# Patient Record
Sex: Female | Born: 1955
Health system: Southern US, Community
[De-identification: ages and names within clinical notes are randomized; demographics above are authoritative.]

## PROBLEM LIST (undated history)

## (undated) DIAGNOSIS — Q851 Tuberous sclerosis: Secondary | ICD-10-CM

## (undated) DIAGNOSIS — R739 Hyperglycemia, unspecified: Secondary | ICD-10-CM

## (undated) DIAGNOSIS — G4730 Sleep apnea, unspecified: Secondary | ICD-10-CM

## (undated) DIAGNOSIS — I10 Essential (primary) hypertension: Secondary | ICD-10-CM

## (undated) DIAGNOSIS — E042 Nontoxic multinodular goiter: Secondary | ICD-10-CM

## (undated) DIAGNOSIS — E039 Hypothyroidism, unspecified: Secondary | ICD-10-CM

## (undated) DIAGNOSIS — R569 Unspecified convulsions: Secondary | ICD-10-CM

## (undated) HISTORY — DX: Unspecified convulsions: R56.9

## (undated) HISTORY — PX: FOOT SURGERY: SHX648

## (undated) HISTORY — DX: Tuberous sclerosis: Q85.1

## (undated) HISTORY — DX: Hyperglycemia, unspecified: R73.9

## (undated) HISTORY — PX: TONSILLECTOMY: SHX5217

## (undated) HISTORY — PX: BREAST EXCISIONAL BIOPSY: SUR124

## (undated) HISTORY — DX: Sleep apnea, unspecified: G47.30

## (undated) HISTORY — DX: Nontoxic multinodular goiter: E04.2

## (undated) HISTORY — PX: BREAST SURGERY: SHX581

## (undated) HISTORY — PX: VARICOSE VEIN SURGERY: SHX832

## (undated) HISTORY — DX: Hypothyroidism, unspecified: E03.9

## (undated) HISTORY — DX: Essential (primary) hypertension: I10

## (undated) HISTORY — PX: LASIK: SHX215

---

## 1993-12-27 HISTORY — PX: ABDOMINAL HYSTERECTOMY: SHX81

## 1998-02-17 ENCOUNTER — Ambulatory Visit (HOSPITAL_COMMUNITY): Admission: RE | Admit: 1998-02-17 | Discharge: 1998-02-17 | Payer: Self-pay | Admitting: Obstetrics and Gynecology

## 1999-01-02 ENCOUNTER — Ambulatory Visit (HOSPITAL_COMMUNITY): Admission: RE | Admit: 1999-01-02 | Discharge: 1999-01-02 | Payer: Self-pay | Admitting: Obstetrics and Gynecology

## 1999-01-02 ENCOUNTER — Encounter: Payer: Self-pay | Admitting: Obstetrics and Gynecology

## 2000-01-11 ENCOUNTER — Encounter: Payer: Self-pay | Admitting: Obstetrics and Gynecology

## 2000-01-11 ENCOUNTER — Ambulatory Visit (HOSPITAL_COMMUNITY): Admission: RE | Admit: 2000-01-11 | Discharge: 2000-01-11 | Payer: Self-pay | Admitting: Obstetrics and Gynecology

## 2001-01-16 ENCOUNTER — Ambulatory Visit (HOSPITAL_COMMUNITY): Admission: RE | Admit: 2001-01-16 | Discharge: 2001-01-16 | Payer: Self-pay | Admitting: Obstetrics and Gynecology

## 2001-01-16 ENCOUNTER — Encounter: Payer: Self-pay | Admitting: Obstetrics and Gynecology

## 2002-02-01 ENCOUNTER — Ambulatory Visit (HOSPITAL_COMMUNITY): Admission: RE | Admit: 2002-02-01 | Discharge: 2002-02-01 | Payer: Self-pay | Admitting: Obstetrics and Gynecology

## 2002-02-01 ENCOUNTER — Encounter: Payer: Self-pay | Admitting: Obstetrics and Gynecology

## 2003-02-26 ENCOUNTER — Encounter: Payer: Self-pay | Admitting: Obstetrics and Gynecology

## 2003-02-26 ENCOUNTER — Ambulatory Visit (HOSPITAL_COMMUNITY): Admission: RE | Admit: 2003-02-26 | Discharge: 2003-02-26 | Payer: Self-pay | Admitting: Obstetrics and Gynecology

## 2003-03-05 ENCOUNTER — Encounter: Admission: RE | Admit: 2003-03-05 | Discharge: 2003-03-05 | Payer: Self-pay | Admitting: Obstetrics and Gynecology

## 2003-03-05 ENCOUNTER — Encounter: Payer: Self-pay | Admitting: Obstetrics and Gynecology

## 2004-07-17 ENCOUNTER — Ambulatory Visit (HOSPITAL_COMMUNITY): Admission: RE | Admit: 2004-07-17 | Discharge: 2004-07-17 | Payer: Self-pay | Admitting: Obstetrics and Gynecology

## 2004-12-27 LAB — HM COLONOSCOPY

## 2005-04-23 ENCOUNTER — Ambulatory Visit (HOSPITAL_COMMUNITY): Admission: RE | Admit: 2005-04-23 | Discharge: 2005-04-23 | Payer: Self-pay | Admitting: Gastroenterology

## 2005-07-21 ENCOUNTER — Ambulatory Visit (HOSPITAL_COMMUNITY): Admission: RE | Admit: 2005-07-21 | Discharge: 2005-07-21 | Payer: Self-pay | Admitting: Obstetrics and Gynecology

## 2006-07-25 ENCOUNTER — Ambulatory Visit (HOSPITAL_COMMUNITY): Admission: RE | Admit: 2006-07-25 | Discharge: 2006-07-25 | Payer: Self-pay | Admitting: Obstetrics and Gynecology

## 2007-07-27 ENCOUNTER — Ambulatory Visit (HOSPITAL_COMMUNITY): Admission: RE | Admit: 2007-07-27 | Discharge: 2007-07-27 | Payer: Self-pay | Admitting: Obstetrics and Gynecology

## 2007-07-27 LAB — HM MAMMOGRAPHY

## 2007-08-04 ENCOUNTER — Encounter: Admission: RE | Admit: 2007-08-04 | Discharge: 2007-08-04 | Payer: Self-pay | Admitting: Obstetrics and Gynecology

## 2008-08-06 ENCOUNTER — Ambulatory Visit (HOSPITAL_COMMUNITY): Admission: RE | Admit: 2008-08-06 | Discharge: 2008-08-06 | Payer: Self-pay | Admitting: Obstetrics and Gynecology

## 2008-08-09 ENCOUNTER — Encounter: Admission: RE | Admit: 2008-08-09 | Discharge: 2008-08-09 | Payer: Self-pay | Admitting: Obstetrics and Gynecology

## 2009-08-08 ENCOUNTER — Ambulatory Visit (HOSPITAL_COMMUNITY): Admission: RE | Admit: 2009-08-08 | Discharge: 2009-08-08 | Payer: Self-pay | Admitting: Endocrinology

## 2010-08-10 ENCOUNTER — Encounter: Admission: RE | Admit: 2010-08-10 | Discharge: 2010-08-10 | Payer: Self-pay | Admitting: Endocrinology

## 2010-08-11 ENCOUNTER — Ambulatory Visit (HOSPITAL_COMMUNITY): Admission: RE | Admit: 2010-08-11 | Discharge: 2010-08-11 | Payer: Self-pay | Admitting: Endocrinology

## 2011-01-17 ENCOUNTER — Encounter: Payer: Self-pay | Admitting: Obstetrics and Gynecology

## 2011-05-14 NOTE — Op Note (Signed)
NAMEMARLIN, BRYS               ACCOUNT NO.:  [id number]   MEDICAL RECORD NO.:  [id number]          PATIENT TYPE:  AMB   LOCATION:  ENDO                         FACILITY:  [facility]   PHYSICIAN:  John C. Madilyn Fireman, M.D.    DATE OF BIRTH:  1956/12/04   DATE OF PROCEDURE:  04/23/2005  DATE OF DISCHARGE:                                 OPERATIVE REPORT   PROCEDURE:  Colonoscopy.   INDICATIONS FOR PROCEDURE:  Average risk colon cancer screening.   DESCRIPTION OF PROCEDURE:  The patient was placed in the left lateral  decubitus position and placed on the pulse monitor with continuous low-flow  oxygen delivered by nasal cannula.  She was sedated with 75 mcg IV fentanyl  and 7 mg IV Versed.  The Olympus video colonoscope was inserted into the  rectum and advanced to the cecum, confirmed by transillumination of  McBurney's point and visualization of the ileocecal valve and appendiceal  orifice.  Prep was excellent.  The cecum, ascending, transverse, descending,  and sigmoid colon all appeared normal with no masses, polyps, diverticula,  or other mucosal abnormalities.  The rectum likewise appeared normal and  retroflexed view of the anus revealed small internal hemorrhoids.  The scope  was then withdrawn and the patient returned to the recovery room in stable  condition.  She  tolerated the procedure well and there were no immediate  complications.   IMPRESSION:  Small internal hemorrhoids; otherwise normal colonoscopy.   PLAN:  Repeat colonoscopy within 10 years and consider flexible  sigmoidoscopy or hemoccults in five years.      JCH/MEDQ  D:  04/23/2005  T:  04/23/2005  Job:  [id number]   cc:   Darius Bump, M.D.  [place]  [place] [address]  Fax: [phone]

## 2012-06-19 ENCOUNTER — Other Ambulatory Visit (HOSPITAL_COMMUNITY): Payer: Self-pay | Admitting: Endocrinology

## 2012-06-19 DIAGNOSIS — Z1231 Encounter for screening mammogram for malignant neoplasm of breast: Secondary | ICD-10-CM

## 2012-07-12 ENCOUNTER — Ambulatory Visit (HOSPITAL_COMMUNITY)
Admission: RE | Admit: 2012-07-12 | Discharge: 2012-07-12 | Disposition: A | Discharge status: Home / Self Care | Payer: BC Managed Care – PPO | Source: Ambulatory Visit | Attending: Endocrinology | Admitting: Endocrinology

## 2012-07-12 DIAGNOSIS — Z1231 Encounter for screening mammogram for malignant neoplasm of breast: Secondary | ICD-10-CM

## 2012-07-27 LAB — HM MAMMOGRAPHY: HM Mammogram: NORMAL

## 2012-08-10 ENCOUNTER — Other Ambulatory Visit: Payer: Self-pay | Admitting: Endocrinology

## 2012-08-10 DIAGNOSIS — E049 Nontoxic goiter, unspecified: Secondary | ICD-10-CM

## 2012-08-11 ENCOUNTER — Ambulatory Visit
Admission: RE | Admit: 2012-08-11 | Discharge: 2012-08-11 | Disposition: A | Discharge status: Home / Self Care | Payer: BC Managed Care – PPO | Source: Ambulatory Visit | Attending: Endocrinology | Admitting: Endocrinology

## 2012-08-11 DIAGNOSIS — E049 Nontoxic goiter, unspecified: Secondary | ICD-10-CM

## 2012-08-31 ENCOUNTER — Encounter: Payer: Self-pay | Admitting: Family Medicine

## 2012-09-01 ENCOUNTER — Encounter: Payer: Self-pay | Admitting: *Deleted

## 2012-09-07 ENCOUNTER — Encounter: Payer: Self-pay | Admitting: *Deleted

## 2012-09-20 ENCOUNTER — Ambulatory Visit (INDEPENDENT_AMBULATORY_CARE_PROVIDER_SITE_OTHER): Payer: BC Managed Care – PPO | Admitting: Family Medicine

## 2012-09-20 ENCOUNTER — Encounter: Payer: Self-pay | Admitting: Family Medicine

## 2012-09-20 VITALS — BP 118/72 | HR 68 | Ht 67.25 in | Wt 264.0 lb

## 2012-09-20 DIAGNOSIS — R5383 Other fatigue: Secondary | ICD-10-CM

## 2012-09-20 DIAGNOSIS — R5381 Other malaise: Secondary | ICD-10-CM

## 2012-09-20 DIAGNOSIS — Z13220 Encounter for screening for lipoid disorders: Secondary | ICD-10-CM

## 2012-09-20 DIAGNOSIS — Z0000 Encounter for general adult medical examination without abnormal findings: Secondary | ICD-10-CM

## 2012-09-20 DIAGNOSIS — E039 Hypothyroidism, unspecified: Secondary | ICD-10-CM

## 2012-09-20 DIAGNOSIS — Z23 Encounter for immunization: Secondary | ICD-10-CM

## 2012-09-20 DIAGNOSIS — Z6841 Body Mass Index (BMI) 40.0 and over, adult: Secondary | ICD-10-CM

## 2012-09-20 LAB — LIPID PANEL
Cholesterol: 162 mg/dL (ref 0–200)
LDL Cholesterol: 90 mg/dL (ref 0–99)
Total CHOL/HDL Ratio: 2.5 Ratio
Triglycerides: 41 mg/dL (ref <150)
VLDL: 8 mg/dL (ref 0–40)

## 2012-09-20 LAB — CBC WITH DIFFERENTIAL/PLATELET
Eosinophils Absolute: 0.3 10*3/uL (ref 0.0–0.7)
Eosinophils Relative: 4 % (ref 0–5)
Hemoglobin: 13.9 g/dL (ref 12.0–15.0)
MCH: 29.1 pg (ref 26.0–34.0)
MCHC: 33.7 g/dL (ref 30.0–36.0)
Monocytes Absolute: 0.5 10*3/uL (ref 0.1–1.0)
Neutro Abs: 4.5 10*3/uL (ref 1.7–7.7)
Neutrophils Relative %: 65 % (ref 43–77)
Platelets: 247 10*3/uL (ref 150–400)
RBC: 4.77 MIL/uL (ref 3.87–5.11)
WBC: 6.8 10*3/uL (ref 4.0–10.5)

## 2012-09-20 LAB — POCT URINALYSIS DIPSTICK
Bilirubin, UA: NEGATIVE
Blood, UA: NEGATIVE
Glucose, UA: NEGATIVE
Spec Grav, UA: 1.015
pH, UA: 5

## 2012-09-20 NOTE — Patient Instructions (Addendum)
HEALTH MAINTENANCE RECOMMENDATIONS:  It is recommended that you get at least 30 minutes of aerobic exercise at least 5 days/week (for weight loss, you may need as much as 60-90 minutes). This can be any activity that gets your heart rate up. This can be divided in 10-15 minute intervals if needed, but try and build up your endurance at least once a week.  Weight bearing exercise is also recommended twice weekly.  Eat a healthy diet with lots of vegetables, fruits and fiber.  "Colorful" foods have a lot of vitamins (ie green vegetables, tomatoes, red peppers, etc).  Limit sweet tea, regular sodas and alcoholic beverages, all of which has a lot of calories and sugar.  Up to 1 alcoholic drink daily may be beneficial for women (unless trying to lose weight, watch sugars).  Drink a lot of water.  Calcium recommendations are 1200-1500 mg daily (1500 mg for postmenopausal women or women without ovaries), and vitamin D 1000 IU daily.  This should be obtained from diet and/or supplements (vitamins), and calcium should not be taken all at once, but in divided doses.  Monthly self breast exams and yearly mammograms for women over the age of 58 is recommended.  Sunscreen of at least SPF 30 should be used on all sun-exposed parts of the skin when outside between the hours of 10 am and 4 pm (not just when at beach or pool, but even with exercise, golf, tennis, and yard work!)  Use a sunscreen that says "broad spectrum" so it covers both UVA and UVB rays, and make sure to reapply every 1-2 hours.  Remember to change the batteries in your smoke detectors when changing your clock times in the spring and fall.  Use your seat belt every time you are in a car, and please drive safely and not be distracted with cell phones and texting while driving.  Check with insurance regarding shingles vaccine (zostavax) and schedule a nurse visit if interested.

## 2012-09-20 NOTE — Progress Notes (Signed)
Chief Complaint  Patient presents with  . Annual Exam    new patient fasting annual exam with pap. UA shows 1+ leuks-pt asymptomatic. Will get flu vaccine the [facility].   April Garcia is a 56 y.o. female who presents for a complete physical.  She has the following concerns: None--just to establish care.  Immunization History  Administered Date(s) Administered  . Td 02/24/2005   Last Pap smear: 2007 Last mammogram: 07/2012 Last colonoscopy: 2006 Last DEXA: 2006 Dentist: every 3 months for cleanings Ophtho: 2 years ago Exercise: 5 miles/day on exercise bike (but not in the last month due to pulled muscle)  Past Medical History  Diagnosis Date  . Elevated blood sugar   . Multinodular goiter   . Hypothyroidism     followed by Dr. Talmage Nap  . Tuberous sclerosis     diagnosed by Dr. Nicholas Lose  . Seizure childhood    off medication since age 42    Past Surgical History  Procedure Date  . Lasik   . Abdominal hysterectomy 1995    for fibroids (Dr. Elana Alm)  . Breast surgery     cyst removal from right breast.  . Tonsillectomy age 6    History   Social History  . Marital Status: Single    Spouse Name: N/A    Number of Children: N/A  . 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 Smokeless tobacco: Never Used  . Alcohol Use: Yes     maybe 2-5 times per year.  . Drug Use: No  . Sexually Active: Not Currently   Other Topics Concern  . Not on file   Social History Narrative   Single, has 1 dog Facilities manager)    Family History  Problem Relation Age of Onset  . Breast cancer Mother 60    stage 0  . Hypothyroidism Mother   . Dementia Father   . Heart disease Father     arrhythmia (cardioverted)  . Hypertension Father   . Colon polyps Father     "precancerous"  . Breast cancer Maternal Aunt   . Breast cancer Maternal Aunt     Current outpatient  prescriptions:levothyroxine (SYNTHROID, LEVOTHROID) 125 MCG tablet, Take 125 mcg by mouth daily., Disp: , Rfl:  Takes a MVI when she remembers  Allergies  Allergen Reactions  . Adhesive (Tape)     ROS:  The patient denies anorexia, fever, weight changes, headaches,  vision changes, decreased hearing, ear pain, sore throat, breast concerns, chest pain, palpitations, dizziness, syncope, dyspnea on exertion, cough, swelling, nausea, vomiting, diarrhea, constipation, abdominal pain, melena, hematochezia, indigestion/heartburn, hematuria, incontinence, dysuria, vaginal bleeding, discharge, odor or itch, genital lesions, joint pains, numbness, tingling, weakness, tremor, suspicious skin lesions, depression, anxiety, abnormal bleeding/bruising, or enlarged lymph nodes. Recent pain at muscle medial to R knee, improved.  PHYSICAL EXAM: BP 118/72  Pulse 68  Ht 5' 7.25" (1.708 m)  Wt 264 lb (119.75 kg)  BMI 41.04 kg/m2  General Appearance:    Alert, cooperative, no distress, appears stated age  Head:    Normocephalic, without obvious abnormality, atraumatic  Eyes:    PERRL, conjunctiva/corneas clear, EOM's intact, fundi    benign  Ears:    Normal TM's and external ear canals  Nose:   Nares normal, mucosa normal, no drainage or sinus   tenderness  Throat:   Lips, mucosa,  and tongue normal; teeth and gums normal  Neck:   Supple, no lymphadenopathy;  Thyroid: Thyroid enlarged, nodule palpable on right, possibly small one on left also; no carotid bruit or JVD  Back:    Spine nontender, no curvature, ROM normal, no CVA     tenderness  Lungs:     Clear to auscultation bilaterally without wheezes, rales or     ronchi; respirations unlabored  Chest Wall:    No tenderness or deformity   Heart:    Regular rate and rhythm, S1 and S2 normal, no murmur, rub   or gallop  Breast Exam:    No tenderness, masses, or nipple discharge or inversion.      No axillary lymphadenopathy  Abdomen:     Soft, non-tender,  obese, nondistended, normoactive bowel sounds,  no masses, no hepatosplenomegaly  Genitalia:    Normal external genitalia without lesions.  BUS and vagina normal; surgically absent uterus.  No adnexal masses or tenderness, but limited by body habitus.  No pap performed (just bimanual exam)  Rectal:    Normal tone, no masses or tenderness; guaiac negative stool  Extremities:   No clubbing, cyanosis; nonpitting edema at ankles bilaterally  Pulses:   2+ and symmetric all extremities  Skin:   Skin color, texture, turgor normal.  Small nonpigmented papules across back and elsewhere, nontender. Onychomycosis of both feet toenails  Lymph nodes:   Cervical, supraclavicular, and axillary nodes normal  Neurologic:   CNII-XII intact, normal strength, sensation and gait; reflexes 2+ and symmetric throughout          Psych:   Normal mood, affect, hygiene and grooming.    ASSESSMENT/PLAN: 1. Routine general medical examination at a health care facility  POCT Urinalysis Dipstick, Visual acuity screening, Lipid panel, Comprehensive metabolic panel, CBC with Differential, Vitamin D 25 hydroxy  2. Other malaise and fatigue  Comprehensive metabolic panel, CBC with Differential, Vitamin D 25 hydroxy  3. Screening for lipoid disorders  Lipid panel  4. Need for Tdap vaccination  Tdap vaccine greater than or equal to 7yo IM  5. Unspecified hypothyroidism    6. Morbid obesity with BMI of 40.0-44.9, adult     Hasn't had labs in 5 years--do labs today.  Not thyroid, as this is monitored by Dr. Talmage Nap  Discussed monthly self breast exams and yearly mammograms after the age of 35; at least 30 minutes of aerobic activity at least 5 days/week; proper sunscreen use reviewed; healthy diet, including goals of calcium and vitamin D intake and alcohol recommendations (less than or equal to 1 drink/day) reviewed; regular seatbelt use; changing batteries in smoke detectors.  Immunization recommendations discussed.  Colonoscopy  recommendations reviewed. Hemoccult kit given  Gets flu shot through school in October Given TdaP today. Check insurance regarding shingles coverage--risks reviewed.  [place] if interested

## 2012-09-21 ENCOUNTER — Encounter: Payer: Self-pay | Admitting: Family Medicine

## 2012-09-21 DIAGNOSIS — E039 Hypothyroidism, unspecified: Secondary | ICD-10-CM | POA: Insufficient documentation

## 2012-12-27 DIAGNOSIS — I10 Essential (primary) hypertension: Secondary | ICD-10-CM

## 2012-12-27 HISTORY — DX: Essential (primary) hypertension: I10

## 2013-01-11 ENCOUNTER — Ambulatory Visit (INDEPENDENT_AMBULATORY_CARE_PROVIDER_SITE_OTHER): Payer: BC Managed Care – PPO | Admitting: Family Medicine

## 2013-01-11 ENCOUNTER — Encounter: Payer: Self-pay | Admitting: Family Medicine

## 2013-01-11 ENCOUNTER — Ambulatory Visit: Payer: BC Managed Care – PPO | Admitting: Family Medicine

## 2013-01-11 VITALS — BP 164/104 | HR 76 | Ht 68.5 in | Wt 280.0 lb

## 2013-01-11 DIAGNOSIS — IMO0001 Reserved for inherently not codable concepts without codable children: Secondary | ICD-10-CM | POA: Insufficient documentation

## 2013-01-11 DIAGNOSIS — R030 Elevated blood-pressure reading, without diagnosis of hypertension: Secondary | ICD-10-CM

## 2013-01-11 NOTE — Patient Instructions (Signed)
I recommend checking your BP's regularly (keep list--date/am/pm/comments as columns).  If you are using an arm cuff, consider nurse visit to verify accuracy.  If cuff too small, try wrist monitor, and bring your monitor to your next visit.  If you develop headaches, numbness/tingling, chest pain, or any other neurologic symptoms, return immediately, and BP medications will need to be started.  Otherwise, let's try and see if BP can improve with exercise, weight loss and dietary measures.   Hypertension As your heart beats, it forces blood through your arteries. This force is your blood pressure. If the pressure is too high, it is called hypertension (HTN) or high blood pressure. HTN is dangerous because you may have it and not know it. High blood pressure may mean that your heart has to work harder to pump blood. Your arteries may be narrow or stiff. The extra work puts you at risk for heart disease, stroke, and other problems.  Blood pressure consists of two numbers, a higher number over a lower, 110/72, for example. It is stated as "110 over 72." The ideal is below 120 for the top number (systolic) and under 80 for the bottom (diastolic). Write down your blood pressure today. You should pay close attention to your blood pressure if you have certain conditions such as:  Heart failure.  Prior heart attack.  Diabetes  Chronic kidney disease.  Prior stroke.  Multiple risk factors for heart disease. To see if you have HTN, your blood pressure should be measured while you are seated with your arm held at the level of the heart. It should be measured at least twice. A one-time elevated blood pressure reading (especially in the Emergency Department) does not mean that you need treatment. There may be conditions in which the blood pressure is different between your right and left arms. It is important to see your caregiver soon for a recheck. Most people have essential hypertension which means that  there is not a specific cause. This type of high blood pressure may be lowered by changing lifestyle factors such as:  Stress.  Smoking.  Lack of exercise.  Excessive weight.  Drug/tobacco/alcohol use.  Eating less salt. Most people do not have symptoms from high blood pressure until it has caused damage to the body. Effective treatment can often prevent, delay or reduce that damage. TREATMENT  When a cause has been identified, treatment for high blood pressure is directed at the cause. There are a large number of medications to treat HTN. These fall into several categories, and your caregiver will help you select the medicines that are best for you. Medications may have side effects. You should review side effects with your caregiver. If your blood pressure stays high after you have made lifestyle changes or started on medicines,   Your medication(s) may need to be changed.  Other problems may need to be addressed.  Be certain you understand your prescriptions, and know how and when to take your medicine.  Be sure to follow up with your caregiver within the time frame advised (usually within two weeks) to have your blood pressure rechecked and to review your medications.  If you are taking more than one medicine to lower your blood pressure, make sure you know how and at what times they should be taken. Taking two medicines at the same time can result in blood pressure that is too low. SEEK IMMEDIATE MEDICAL CARE IF:  You develop a severe headache, blurred or changing vision, or confusion.  You have unusual weakness or numbness, or a faint feeling.  You have severe chest or abdominal pain, vomiting, or breathing problems. MAKE SURE YOU:   Understand these instructions.  Will watch your condition.  Will get help right away if you are not doing well or get worse. Document Released: 12/13/2005 Document Revised: 03/06/2012 Document Reviewed: 08/02/2008 [facility] Patient  Information 2013 [place], [place].  2 Gram Low Sodium Diet A 2 gram sodium diet restricts the amount of sodium in the diet to no more than 2 g or 2000 mg daily. Limiting the amount of sodium is often used to help lower blood pressure. It is important if you have heart, liver, or kidney problems. Many foods contain sodium for flavor and sometimes as a preservative. When the amount of sodium in a diet needs to be low, it is important to know what to look for when choosing foods and drinks. The following includes some information and guidelines to help make it easier for you to adapt to a low sodium diet. QUICK TIPS  Do not add salt to food.  Avoid convenience items and fast food.  Choose unsalted snack foods.  Buy lower sodium products, often labeled as "lower sodium" or "no salt added."  Check food labels to learn how much sodium is in 1 serving.  When eating at a restaurant, ask that your food be prepared with less salt or none, if possible. READING FOOD LABELS FOR SODIUM INFORMATION The nutrition facts label is a good place to find how much sodium is in foods. Look for products with no more than 500 to 600 mg of sodium per meal and no more than 150 mg per serving. Remember that 2 g = 2000 mg. The food label may also list foods as:  Sodium-free: Less than 5 mg in a serving.  Very low sodium: 35 mg or less in a serving.  Low-sodium: 140 mg or less in a serving.  Light in sodium: 50% less sodium in a serving. For example, if a food that usually has 300 mg of sodium is changed to become light in sodium, it will have 150 mg of sodium.  Reduced sodium: 25% less sodium in a serving. For example, if a food that usually has 400 mg of sodium is changed to reduced sodium, it will have 300 mg of sodium. CHOOSING FOODS Grains  Avoid: Salted crackers and snack items. Some cereals, including instant hot cereals. Bread stuffing and biscuit mixes. Seasoned rice or pasta mixes.  Choose: Unsalted  snack items. Low-sodium cereals, oats, puffed wheat and rice, shredded wheat. English muffins and bread. Pasta. Meats  Avoid: Salted, canned, smoked, spiced, pickled meats, including fish and poultry. Bacon, ham, sausage, cold cuts, hot dogs, anchovies.  Choose: Low-sodium canned tuna and salmon. Fresh or frozen meat, poultry, and fish. Dairy  Avoid: Processed cheese and spreads. Cottage cheese. Buttermilk and condensed milk. Regular cheese.  Choose: Milk. Low-sodium cottage cheese. Yogurt. Sour cream. Low-sodium cheese. Fruits and Vegetables  Avoid: Regular canned vegetables. Regular canned tomato sauce and paste. Frozen vegetables in sauces. Olives. Rosita Fire. Relishes. Sauerkraut.  Choose: Low-sodium canned vegetables. Low-sodium tomato sauce and paste. Frozen or fresh vegetables. Fresh and frozen fruit. Condiments  Avoid: Canned and packaged gravies. Worcestershire sauce. Tartar sauce. Barbecue sauce. Soy sauce. Steak sauce. Ketchup. Onion, garlic, and table salt. Meat flavorings and tenderizers.  Choose: Fresh and dried herbs and spices. Low-sodium varieties of mustard and ketchup. Lemon juice. Tabasco sauce. Horseradish. SAMPLE 2 GRAM SODIUM MEAL  PLAN Breakfast / Sodium (mg)  1 cup low-fat milk / 143 mg  2 slices whole-wheat toast / 270 mg  1 tbs heart-healthy margarine / 153 mg  1 hard-boiled egg / 139 mg  1 small orange / 0 mg Lunch / Sodium (mg)  1 cup raw carrots / 76 mg   cup hummus / 298 mg  1 cup low-fat milk / 143 mg   cup red grapes / 2 mg  1 whole-wheat pita bread / 356 mg Dinner / Sodium (mg)  1 cup whole-wheat pasta / 2 mg  1 cup low-sodium tomato sauce / 73 mg  3 oz lean ground beef / 57 mg  1 small side salad (1 cup raw spinach leaves,  cup cucumber,  cup yellow bell pepper) with 1 tsp olive oil and 1 tsp red wine vinegar / 25 mg Snack / Sodium (mg)  1 container low-fat vanilla yogurt / 107 mg  3 graham cracker squares / 127 mg Nutrient  Analysis  Calories: 2033  Protein: 77 g  Carbohydrate: 282 g  Fat: 72 g  Sodium: 1971 mg Document Released: 12/13/2005 Document Revised: 03/06/2012 Document Reviewed: 03/16/2010 [facility] Patient Information 2013 [place], [place].

## 2013-01-11 NOTE — Progress Notes (Signed)
Chief Complaint  Patient presents with  . Hypertension    went to dentist yesterday and bp was high. she has gone to [employer] and everything has been fine   Yesterday while at dentist she was found to have a high BP, reading 177/103 in her upper arm, and 168/88 in her wrist.  She wasn't in any pain, and denied feeling particularly anxious.  She had her BP checked this morning at work and was 150/111 on upper arm, wasn't sure if reading accurate through sweater, so moved this arm cuff down to the forearm, and got 160/117.  First thing in the morning she has noticed her heart more than normal--heart wasn't fast, no palpitations, but just more aware of her heart.  Thought her BP could be high.  Denies eating canned foods, processed meats.  Occasional fast food. No chinese food.  She hasn't been getting any exercise recently (used to use exercise bike 30 minutes daily).  She has gained 16 pounds since she was last here in September (when her BP was normal).  She did some "emotional eating" after her dog died.  Dr. Talmage Nap monitors her thyroid, last checked in August.  She denies any skin/bowel/hair or mood changes  She denies having headaches, sometimes just feels a little "fuzzy", very dull headache. Denies chest pain, shortness of breath, neurologic symptoms.  She is concerned about her blood pressure, due to family h/o strokes in 2 cousins (one at age 74, another at age 63).  With this family history, she is very concerned about her BP, anxious.  Past Medical History  Diagnosis Date  . Elevated blood sugar   . Multinodular goiter   . Hypothyroidism     followed by Dr. Talmage Nap  . Tuberous sclerosis     diagnosed by Dr. Nicholas Lose  . Seizure childhood    off medication since age 62   Past Surgical History  Procedure Date  . Lasik   . Abdominal hysterectomy 1995    for fibroids (Dr. Elana Alm)  . Breast surgery     cyst removal from right breast.  . Tonsillectomy age 48  . Varicose vein surgery       Dr. Guss Bunde   Family History  Problem Relation Age of Onset  . Breast cancer Mother 57    stage 0  . Hypothyroidism Mother   . Dementia Father   . Heart disease Father     arrhythmia (cardioverted)  . Hypertension Father   . Colon polyps Father     "precancerous"  . Breast cancer Maternal Aunt   . Breast cancer Maternal Aunt   . Stroke Cousin   . Stroke Cousin    History   Social History  . Marital Status: Single    Spouse Name: N/A    Number of Children: N/A  . 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 Smokeless tobacco: Never Used  . Alcohol Use: Yes     Comment: maybe 2-5 times per year.  . Drug Use: No  . Sexually Active: Not Currently   Other Topics Concern  . Not on file   Social History Narrative   Single, has 1 dog.    Current Outpatient Prescriptions on File Prior to Visit  Medication Sig Dispense Refill  . levothyroxine (SYNTHROID, LEVOTHROID) 125 MCG tablet Take 125 mcg by mouth daily.  Allergies  Allergen Reactions  . Adhesive (Tape)    ROS:  Denies fevers, URI symptom, headaches, dizziness, chest pain, nausea, vomiting, diarrhea, constipation, cough, shortness of breath, depression/anxiety, joint pains, or other concerns.  See HPI  PHYSICAL EXAM: BP 164/104  Pulse 76  Ht 5' 8.5" (1.74 m)  Wt 280 lb (127.007 kg)  BMI 41.95 kg/m2 164/104 on RA by MD Pleasant, mildly anxious-appearing obese female, in no distress HEENT:  PERRL, EOMI, conjunctiva and fundi normal.  OP clear Neck: no lymphadenopathy, thyromegaly or mass. No carotid bruit Heart: regular rate and rhythm without murmur Lungs: clear bilaterally Abdomen: soft, nontender, no organomegaly or mass, no abdominal bruit Extremities: no edema, 2+ pulse Psych: mildly anxious, normal hygiene and grooming, normal range of affect Neuro: alert and oriented,  cranial nerves 2-12 intact. Normal strength, sensation, gait  ASSESSMENT/PLAN: 1. Elevated BP    No h/o HTN, but clearly has persistently elevated BP's since at dentist yesterday.  BP's normal at last visit, prior to weight gain.  Counseled extensively regarding low sodium diet, daily exercise, weight loss, risks of HTN.  Discussed starting med now, and cutting back on dose as her BP drops, vs behavioral/dietary trial, and adding meds if BP's do not respond.  She prefers the latter, which is reasonable, although if not showing any response, not to wait until next visit in 6 weeks, to return sooner.  Understands need for immediate eval for headache, neurologic symptoms, chest pain, exertional dyspnea

## 2013-02-22 ENCOUNTER — Encounter: Payer: Self-pay | Admitting: Family Medicine

## 2013-02-22 ENCOUNTER — Ambulatory Visit (INDEPENDENT_AMBULATORY_CARE_PROVIDER_SITE_OTHER): Payer: BC Managed Care – PPO | Admitting: Family Medicine

## 2013-02-22 VITALS — BP 160/118 | HR 80 | Ht 68.5 in | Wt 275.0 lb

## 2013-02-22 DIAGNOSIS — Z6841 Body Mass Index (BMI) 40.0 and over, adult: Secondary | ICD-10-CM

## 2013-02-22 DIAGNOSIS — I10 Essential (primary) hypertension: Secondary | ICD-10-CM

## 2013-02-22 DIAGNOSIS — E039 Hypothyroidism, unspecified: Secondary | ICD-10-CM

## 2013-02-22 MED ORDER — LISINOPRIL-HYDROCHLOROTHIAZIDE 10-12.5 MG PO TABS: 1.0000 | ORAL_TABLET | Freq: Every day | ORAL | Status: DC

## 2013-02-22 NOTE — Progress Notes (Signed)
Chief Complaint  Patient presents with  . Hypertension    6 week bp follow up.   Patient presents for follow-up on her blood pressure.  She was seen last month for elevated blood pressure, after first being noted at dentist.  This was also after a significant weight gain since BP's were normal at her physical in the fall. She has bought a wrist monitor, and checking regularly at home.  Initial values are often high (140-150's/90-113), lower on repeat after doing some deep breathing for a few minutes (122-146/72-92).  Currently denies any headache, but feels like she can hear her pulse beating.  Once with high BP (146/113) she felt a little "fuzzy" while at work.  She has tried to cut back on her sodium.  She is trying to exercise more, but hasn't been able to be consistent.  She is riding bicycle 30 minutes 3x/week. She has lost 5 pounds since her last visit.  She sees Dr. Talmage Nap for her thyroid, last check was prior to the weight gain in the fall.  Other than having a harder time getting the weight off quickly, notices no other symptoms.  Denies hair/skin/bowel changes, fatigue.  Past Medical History  Diagnosis Date  . Elevated blood sugar   . Multinodular goiter   . Hypothyroidism     followed by Dr. Talmage Nap  . Tuberous sclerosis     diagnosed by Dr. Nicholas Lose  . Seizure childhood    off medication since age 29   Past Surgical History  Procedure Laterality Date  . Lasik    . Abdominal hysterectomy  1995    for fibroids (Dr. Elana Alm)  . Breast surgery      cyst removal from right breast.  . Tonsillectomy  age 58  . Varicose vein surgery      Dr. Guss Bunde   History   Social History  . Marital Status: Single    Spouse Name: N/A    Number of Children: N/A  . 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 Smokeless tobacco: Never Used  . Alcohol Use: Yes   Comment: maybe 2-5 times per year.  . Drug Use: No  . Sexually Active: Not Currently   Other Topics Concern  . Not on file   Social History Narrative   Single, has 1 dog.    Current outpatient prescriptions:levothyroxine (SYNTHROID, LEVOTHROID) 125 MCG tablet, Take 125 mcg by mouth daily., Disp: , Rfl:   Allergies  Allergen Reactions  . Adhesive (Tape)    ROS:  Denies fevers, URI symptoms, headache, dizziness, cough, shortness of breath, chest pain, palpitations (just sometimes feels her heart beating hard, and hears it in her ears).  Some intermittent muffled hearing ,improving after blowing her nose.  Denies urinary complaints, joint pains or other concerns.  PHYSICAL EXAM: BP 160/118  Pulse 80  Ht 5' 8.5" (1.74 m)  Wt 275 lb (124.739 kg)  BMI 41.2 kg/m2 156/121 on R wrist, pt machine (done her way, holding arm elevated) 150/117 on R wrist when holding monitor against heart as instructed 162/108 on R arm by MD with large cuff Pleasant, obese female in no distress Neck: no lymphadenopathy Heart: regular rate and rhythm without murmur Lungs: clear bilaterally Extremities: trace edema Skin: no rash, very slight stasis changes in LE's Psych:  Normal mood, affect, hygiene and grooming Neuro: alert and  oriented.  Cranial nerves intact.  Normal gait, strength, sensation  ASSESSMENT/PLAN: Essential hypertension, benign - Plan: lisinopril-hydrochlorothiazide (PRINZIDE,ZESTORETIC) 10-12.5 MG per tablet  Morbid obesity with BMI of 40.0-44.9, adult  Unspecified hypothyroidism  HTN:  Start Lisinopril HCT--risks and side effects reviewed in detail.  Plan to check b-met along with repeating TSH at next visit (and forward TSH to Dr. Talmage Nap if abnormal). Continue low sodium diet. Increase exercise frequency and duration, if possible Continue to monitor BP's at home.  Monitor seems fairly accurate. High potassium foods reviewed--increase if having muscle cramps/spasms.  Keep well  hydrated  F/u 3-4 weeks. To call sooner if having dizziness, severe cough or other side effects

## 2013-02-22 NOTE — Patient Instructions (Signed)
Continue low sodium diet. Increase exercise frequency and duration, if possible Continue to monitor BP's at home.  Monitor seems fairly accurate. High potassium foods reviewed--increase if having muscle cramps/spasms.  Keep well hydrated

## 2013-03-19 ENCOUNTER — Telehealth: Payer: Self-pay | Admitting: Family Medicine

## 2013-03-19 NOTE — Telephone Encounter (Signed)
She has appointment with you on Thursday to reck new bp meds. She had to stop them on Saturday. She developed a bad rash, felt like cross between poison ivy and bad sunburn. She read that 1% of people on this medication can have rash as side effect. The rash is still there but improving. She still plans to come in on Thursday but wanted to make you aware of this first

## 2013-03-19 NOTE — Telephone Encounter (Signed)
noted 

## 2013-03-22 ENCOUNTER — Ambulatory Visit (INDEPENDENT_AMBULATORY_CARE_PROVIDER_SITE_OTHER): Payer: BC Managed Care – PPO | Admitting: Family Medicine

## 2013-03-22 ENCOUNTER — Encounter: Payer: Self-pay | Admitting: Family Medicine

## 2013-03-22 VITALS — BP 176/110 | HR 60 | Ht 67.0 in | Wt 275.0 lb

## 2013-03-22 DIAGNOSIS — E039 Hypothyroidism, unspecified: Secondary | ICD-10-CM

## 2013-03-22 DIAGNOSIS — I10 Essential (primary) hypertension: Secondary | ICD-10-CM

## 2013-03-22 MED ORDER — AMLODIPINE BESYLATE 5 MG PO TABS: ORAL_TABLET | ORAL | Status: DC

## 2013-03-22 NOTE — Progress Notes (Signed)
Chief Complaint  Patient presents with  . Hypertension    4 week follow up on bp. Stopped taking bp medication-this past Sat was last dose. Brought pictures to show you her reaction.   Patient had a reaction to the lisinopril HCT.  Last week she developed a rash across neck, chest, into side of her face--was very red, itchy and hot.  She had no change in detergents, had worn the same sweater before.  Only change was the addition of the BP medication.  She stopped the medication 5 days ago.  BP was 110-130's/70's-80's while on the medication.  Since stopping med, BP's back up to 130's-160/90-114.  Denies headaches, dizziness, chest pain, edema or shortness of breath.  Rash has resolved.  Hypothyroidism--denies fatigue.  Has gained weight, but no additional weight gain since last visit.  +dry skin (winter), no decrease in energy, no hair loss. Sees Dr. Talmage Nap for her thyroid, last TSH was in the summer.  She gained the weight since her last TSH  Past Medical History  Diagnosis Date  . Elevated blood sugar   . Multinodular goiter   . Hypothyroidism     followed by Dr. Talmage Nap  . Tuberous sclerosis     diagnosed by Dr. Nicholas Lose  . Seizure childhood    off medication since age 61   Past Surgical History  Procedure Laterality Date  . Lasik    . Abdominal hysterectomy  1995    for fibroids (Dr. Elana Alm)  . Breast surgery      cyst removal from right breast.  . Tonsillectomy  age 10  . Varicose vein surgery      Dr. Guss Bunde   History   Social History  . Marital Status: Single    Spouse Name: N/A    Number of Children: N/A  . 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 Smokeless tobacco: Never Used  . Alcohol Use: Yes     Comment: maybe 2-5 times per year.  . Drug Use: No  . Sexually Active: Not Currently   Other Topics Concern  . Not on file   Social  History Narrative   Single, has 1 dog.    Current Outpatient Prescriptions on File Prior to Visit  Medication Sig Dispense Refill  . levothyroxine (SYNTHROID, LEVOTHROID) 125 MCG tablet Take 125 mcg by mouth daily.       No current facility-administered medications on file prior to visit.   Allergies  Allergen Reactions  . Adhesive (Tape)   . Lisinopril-Hydrochlorothiazide Rash   ROS:  Denies fevers, URI symptoms, cough, shortness of breath. Rash resolved.  No edema or other concerns.  Moods are good. No further weight gain  PHYSICAL EXAM: BP 176/110  Pulse 60  Ht 5\' 7"  (1.702 m)  Wt 275 lb (124.739 kg)  BMI 43.06 kg/m2 180/108 on repeat by MD, RA Neck: no lymphadenopathy, thyromegaly or mass Heart: regular rate and rhythm without murmur Lungs: clear bilaterally Extremities: no edema Skin no rash Psych: normal mood, affect, hygiene and grooming Neuro: alert and oriented, normal gait, cranial nerves.  ASSESSMENT/PLAN:  Essential hypertension, benign - Plan: amLODipine (NORVASC) 5 MG tablet, DISCONTINUED: amLODipine (NORVASC) 5 MG tablet  Unspecified hypothyroidism - Plan: TSH  HTN--was well controlled with lisinopril HCT, but she developed rash.  Start amlodipine 5mg .  Increase to 10mg  after 1-2 weeks if  BP remains >140/90.  Call to have rx changed to 10mg  if tolerating at the higher dose.  Continue to work on exercise and weight loss.  Check TSH today given significant weight gain since her last TSH, to ensure current dose is adequate.  Forward results to Dr. Talmage Nap  F/u 6 weeks on HTN

## 2013-03-22 NOTE — Patient Instructions (Addendum)
Start amlodipine 5mg .  Increase to 10mg  after 1-2 weeks if BP remains >140/90.  Call to have rx changed to 10mg  if tolerating at the higher dose.  Continue to work on exercise and weight loss.  Your prescription was sent to the nearby pharmacy--[facility] on [address] and [address] (it was also sent to [facility], so you will have it there for your refill, if needed).

## 2013-05-03 ENCOUNTER — Encounter: Payer: Self-pay | Admitting: Family Medicine

## 2013-05-03 ENCOUNTER — Ambulatory Visit (INDEPENDENT_AMBULATORY_CARE_PROVIDER_SITE_OTHER): Payer: BC Managed Care – PPO | Admitting: Family Medicine

## 2013-05-03 VITALS — BP 160/96 | HR 72 | Ht 67.0 in | Wt 275.0 lb

## 2013-05-03 DIAGNOSIS — Z6841 Body Mass Index (BMI) 40.0 and over, adult: Secondary | ICD-10-CM

## 2013-05-03 DIAGNOSIS — I10 Essential (primary) hypertension: Secondary | ICD-10-CM

## 2013-05-03 NOTE — Patient Instructions (Signed)
Continue to monitor your BP at home.  Clearly they are higher at the office, and so I will always ask what they are running at home, so please continue to check. Return sooner than September physical if having BP's too high, too low, or any other concerns.  Have pharmacy call when you need refills.

## 2013-05-03 NOTE — Progress Notes (Signed)
Chief Complaint  Patient presents with  . Hypertension    6 week follow up.     Patient presents for f/u on hypertension.  She was changed from lisinopril HCT to amlodipine after developing a rash.  She has been taking 5mg  daily and BP's at home had been running 115-138/70's-80's, so she maintained on the 5 mg dose.  Denies any side effects--just very slight swelling in feet.  No headaches, dizziness, chest pain.  Exercises sporadically, not on a consistent basis.  Hasn't had any change in weight.  Past Medical History  Diagnosis Date  . Elevated blood sugar   . Multinodular goiter   . Hypothyroidism     followed by Dr. Talmage Nap  . Tuberous sclerosis     diagnosed by Dr. Nicholas Lose  . Seizure childhood    off medication since age 17   Past Surgical History  Procedure Laterality Date  . Lasik    . Abdominal hysterectomy  1995    for fibroids (Dr. Elana Alm)  . Breast surgery      cyst removal from right breast.  . Tonsillectomy  age 37  . Varicose vein surgery      Dr. Guss Bunde   History   Social History  . Marital Status: Single    Spouse Name: N/A    Number of Children: N/A  . 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 Smokeless tobacco: Never Used  . Alcohol Use: Yes     Comment: maybe 2-5 times per year.  . Drug Use: No  . Sexually Active: Not Currently   Other Topics Concern  . Not on file   Social History Narrative   Single, has 1 dog.     Current outpatient prescriptions:amLODipine (NORVASC) 5 MG tablet, Increase to 2 tablets daily after 1-2 weeks if BP remains >140/90., Disp: 30 tablet, Rfl: 1;  levothyroxine (SYNTHROID, LEVOTHROID) 125 MCG tablet, Take 125 mcg by mouth daily., Disp: , Rfl:   Allergies  Allergen Reactions  . Adhesive (Tape)   . Lisinopril-Hydrochlorothiazide Rash   ROS:  Denies fevers, URI symptoms, chest pain,  headaches, dizziness, rashes or other concerns.  PHYSICAL EXAM: BP 160/96  Pulse 72  Ht 5\' 7"  (1.702 m)  Wt 275 lb (124.739 kg)  BMI 43.06 kg/m2 Well developed, pleasant female in no distress Repeat BP 160/100 RA With patient's wrist monitor on RA:  152/104 Heart: regular rate and rhythm Lungs: clear bilaterally Extremities: no edema noted Skin: no rash Psych: normal mood, affect, hygiene and grooming  ASSESSMENT/PLAN:  Essential hypertension, benign  Morbid obesity with BMI of 40.0-44.9, adult  HTN--white coat component.  Numbers at home and work are acceptable, and pt's machine appears accurate.  Therefore, continue 5mg  dose of amlodipine. Weight loss encouraged--daily exercise, dietary changes. Pharmacy to contact us when refills needed  F/u at [facility] as scheduled for September, sooner if having symptoms/problems, or if BP's are running too high or too low at home.

## 2013-05-19 ENCOUNTER — Other Ambulatory Visit: Payer: Self-pay | Admitting: Family Medicine

## 2013-07-06 ENCOUNTER — Other Ambulatory Visit: Payer: Self-pay | Admitting: Family Medicine

## 2013-07-06 DIAGNOSIS — Z1231 Encounter for screening mammogram for malignant neoplasm of breast: Secondary | ICD-10-CM

## 2013-07-13 ENCOUNTER — Ambulatory Visit (HOSPITAL_COMMUNITY)
Admission: RE | Admit: 2013-07-13 | Discharge: 2013-07-13 | Disposition: A | Discharge status: Home / Self Care | Payer: BC Managed Care – PPO | Source: Ambulatory Visit | Attending: Family Medicine | Admitting: Family Medicine

## 2013-07-13 DIAGNOSIS — Z1231 Encounter for screening mammogram for malignant neoplasm of breast: Secondary | ICD-10-CM

## 2013-07-13 LAB — HM MAMMOGRAPHY: HM Mammogram: NORMAL

## 2013-08-09 ENCOUNTER — Other Ambulatory Visit: Payer: Self-pay | Admitting: Endocrinology

## 2013-08-09 DIAGNOSIS — E049 Nontoxic goiter, unspecified: Secondary | ICD-10-CM

## 2013-08-13 ENCOUNTER — Ambulatory Visit
Admission: RE | Admit: 2013-08-13 | Discharge: 2013-08-13 | Disposition: A | Discharge status: Home / Self Care | Payer: BC Managed Care – PPO | Source: Ambulatory Visit | Attending: Endocrinology | Admitting: Endocrinology

## 2013-08-13 DIAGNOSIS — E049 Nontoxic goiter, unspecified: Secondary | ICD-10-CM

## 2013-08-27 HISTORY — PX: MOUTH SURGERY: SHX715

## 2013-10-01 ENCOUNTER — Ambulatory Visit (INDEPENDENT_AMBULATORY_CARE_PROVIDER_SITE_OTHER): Payer: BC Managed Care – PPO | Admitting: Family Medicine

## 2013-10-01 ENCOUNTER — Encounter: Payer: Self-pay | Admitting: Family Medicine

## 2013-10-01 VITALS — BP 150/90 | HR 64 | Ht 68.0 in | Wt 268.0 lb

## 2013-10-01 DIAGNOSIS — R82998 Other abnormal findings in urine: Secondary | ICD-10-CM

## 2013-10-01 DIAGNOSIS — T148XXA Other injury of unspecified body region, initial encounter: Secondary | ICD-10-CM

## 2013-10-01 DIAGNOSIS — E559 Vitamin D deficiency, unspecified: Secondary | ICD-10-CM

## 2013-10-01 DIAGNOSIS — Z0000 Encounter for general adult medical examination without abnormal findings: Secondary | ICD-10-CM

## 2013-10-01 DIAGNOSIS — R8290 Unspecified abnormal findings in urine: Secondary | ICD-10-CM

## 2013-10-01 DIAGNOSIS — I10 Essential (primary) hypertension: Secondary | ICD-10-CM

## 2013-10-01 LAB — CBC WITH DIFFERENTIAL/PLATELET
Basophils Absolute: 0 10*3/uL (ref 0.0–0.1)
Eosinophils Absolute: 0.2 10*3/uL (ref 0.0–0.7)
Lymphs Abs: 1.3 10*3/uL (ref 0.7–4.0)
MCH: 26.4 pg (ref 26.0–34.0)
Monocytes Relative: 7 % (ref 3–12)
Neutro Abs: 5 10*3/uL (ref 1.7–7.7)
Neutrophils Relative %: 71 % (ref 43–77)
Platelets: 261 10*3/uL (ref 150–400)
RBC: 4.81 MIL/uL (ref 3.87–5.11)
RDW: 16.1 % — ABNORMAL HIGH (ref 11.5–15.5)
WBC: 7 10*3/uL (ref 4.0–10.5)

## 2013-10-01 LAB — COMPREHENSIVE METABOLIC PANEL
AST: 12 U/L (ref 0–37)
Albumin: 4.3 g/dL (ref 3.5–5.2)
BUN: 20 mg/dL (ref 6–23)
Calcium: 9.3 mg/dL (ref 8.4–10.5)
Chloride: 105 mEq/L (ref 96–112)
Glucose, Bld: 89 mg/dL (ref 70–99)
Potassium: 3.9 mEq/L (ref 3.5–5.3)

## 2013-10-01 LAB — POCT UA - MICROSCOPIC ONLY

## 2013-10-01 LAB — POCT URINALYSIS DIPSTICK
Bilirubin, UA: NEGATIVE
Blood, UA: NEGATIVE
Ketones, UA: NEGATIVE
Nitrite, UA: NEGATIVE
Protein, UA: NEGATIVE
pH, UA: 5

## 2013-10-01 NOTE — Patient Instructions (Signed)
HEALTH MAINTENANCE RECOMMENDATIONS:  It is recommended that you get at least 30 minutes of aerobic exercise at least 5 days/week (for weight loss, you may need as much as 60-90 minutes). This can be any activity that gets your heart rate up. This can be divided in 10-15 minute intervals if needed, but try and build up your endurance at least once a week.  Weight bearing exercise is also recommended twice weekly.  Eat a healthy diet with lots of vegetables, fruits and fiber.  "Colorful" foods have a lot of vitamins (ie green vegetables, tomatoes, red peppers, etc).  Limit sweet tea, regular sodas and alcoholic beverages, all of which has a lot of calories and sugar.  Up to 1 alcoholic drink daily may be beneficial for women (unless trying to lose weight, watch sugars).  Drink a lot of water.  Calcium recommendations are 1200-1500 mg daily (1500 mg for postmenopausal women or women without ovaries), and vitamin D 1000 IU daily.  This should be obtained from diet and/or supplements (vitamins), and calcium should not be taken all at once, but in divided doses.  Monthly self breast exams and yearly mammograms for women over the age of 45 is recommended.  Sunscreen of at least SPF 30 should be used on all sun-exposed parts of the skin when outside between the hours of 10 am and 4 pm (not just when at beach or pool, but even with exercise, golf, tennis, and yard work!)  Use a sunscreen that says "broad spectrum" so it covers both UVA and UVB rays, and make sure to reapply every 1-2 hours.  Remember to change the batteries in your smoke detectors when changing your clock times in the spring and fall.  Use your seat belt every time you are in a car, and please drive safely and not be distracted with cell phones and texting while driving.   Continue to monitor blood pressures;  Bring list to next visit, along with your monitor to verify once again.

## 2013-10-01 NOTE — Progress Notes (Signed)
Chief Complaint  Patient presents with  . Annual Exam    fasting annual exam with either pap/pelvic, unsure of which is needed. Will get flu shot through school. UA showed  2+ leuks, patient is asymptomatic. No major concerns.    She donates blood every 2 months.  Had to stop for a while due to some anemia, but more recently giving regularly again.  The last time she tried to donate she was turned away due to Hg of 11.9, repeat was 12.2.  Since then, she changed MVI to one which contains iron.    She had oral surgery 9/26 (removed a cyst from upper jaw)--had a black L eye and a lot of bruising, more than they expected.  Had not been taking any NSAIDs prior to surgery.  Hypertension follow-up:  Blood pressures elsewhere are 105-132/66-84, pulse 68-84.  Denies dizziness, headaches, chest pain.  Denies side effects of medications--denies edema or constipation.  Saw Dr. Talmage Nap in August, had f/u ultrasound. No med changes made.  Immunization History  Administered Date(s) Administered  . Influenza Split 09/26/2012  . Td 02/24/2005  . Tdap 09/20/2012  gets flu shots through school--scheduled for 10/25/13 Last Pap smear: 2007 (s/p hysterectomy) Last mammogram: 06/2013  Last colonoscopy: 2006--she got a notice after 5 years, but hasn't gone yet Last DEXA: 2006  Dentist: every 3 months for cleanings  Ophtho: 3 years ago  Exercise: 5-6 miles/day on exercise bike every day  Past Medical History  Diagnosis Date  . Elevated blood sugar   . Multinodular goiter   . Hypothyroidism     followed by Dr. Talmage Nap  . Tuberous sclerosis     diagnosed by Dr. Nicholas Lose  . Seizure childhood    off medication since age 28  . Hypertension 2014    Past Surgical History  Procedure Laterality Date  . Lasik      monovision (R for distance, L for reading)  . Abdominal hysterectomy  1995    for fibroids (Dr. Elana Alm)  . Breast surgery      cyst removal from right breast.  . Tonsillectomy  age 22  . Varicose  vein surgery      Dr. Guss Bunde  . Mouth surgery  08/2013    cyst removed, upper jaw  . Foot surgery      toenails removed and tubers removed    History   Social History  . Marital Status: Single    Spouse Name: N/A    Number of Children: N/A  . 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 Smokeless tobacco: Never Used  . Alcohol Use: Yes     Comment: maybe 2-5 times per year.  . Drug Use: No  . Sexual Activity: Not Currently   Other Topics Concern  . Not on file   Social History Narrative   Single, has 1 dog (toy poodle)    Family History  Problem Relation Age of Onset  . Breast cancer Mother 10    stage 0  . Hypothyroidism Mother   . Dementia Father   . Heart disease Father     arrhythmia (cardioverted)  . Hypertension Father   . Colon polyps Father     "precancerous"  . Breast cancer Maternal Aunt   . Breast cancer Maternal Aunt   . Stroke Cousin   . Stroke Cousin   .  Diabetes Neg Hx   . Colon cancer Cousin 67    Current outpatient prescriptions:amLODipine (NORVASC) 5 MG tablet, , Disp: , Rfl: ;  chlorhexidine (PERIDEX) 0.12 % solution, Use as directed 15 mLs in the mouth or throat 2 (two) times daily. , Disp: , Rfl: ;  levothyroxine (SYNTHROID, LEVOTHROID) 125 MCG tablet, Take 125 mcg by mouth daily., Disp: , Rfl: ;  Multiple Vitamins-Minerals (MULTIVITAMIN PO), Take 1 tablet by mouth daily., Disp: , Rfl:  penicillin v potassium (VEETID) 500 MG tablet, Take 500 mg by mouth 4 (four) times daily. , Disp: , Rfl:   Allergies  Allergen Reactions  . Adhesive [Tape]   . Lisinopril-Hydrochlorothiazide Rash   ROS: The patient denies anorexia, fever, headaches, vision changes, decreased hearing, ear pain, sore throat, breast concerns, chest pain, palpitations, dizziness, syncope, dyspnea on exertion, cough, swelling, nausea, vomiting,  diarrhea, constipation, abdominal pain, melena, hematochezia, indigestion/heartburn, hematuria, dysuria, vaginal bleeding, discharge, odor or itch, genital lesions, joint pains, numbness, tingling, weakness, tremor, suspicious skin lesions, depression, anxiety, abnormal bleeding/bruising, or enlarged lymph nodes.  Very mild/occasional stress incontinence with laughing.  No hot flashes Hasn't seen Dr. Nicholas Lose in the last 2-3 years, but plans to, for skin checks. She gained weight last year, but has slowly been losing since January  PHYSICAL EXAM: BP 150/90  Pulse 64  Ht 5\' 8"  (1.727 m)  Wt 268 lb (121.564 kg)  BMI 40.76 kg/m2  General Appearance:  Alert, cooperative, no distress, appears stated age   Head:  Normocephalic, without obvious abnormality, atraumatic   Eyes:  PERRL, conjunctiva/corneas clear, EOM's intact, fundi  benign   Ears:  Normal TM's and external ear canals   Nose:  Nares normal, mucosa normal, no drainage or sinus tenderness   Throat:  Lips, mucosa, and tongue normal; teeth normal.  Area of inflammation behind her two front teeth  Neck:  Supple, no lymphadenopathy; Thyroid: Thyroid enlarged, nodule palpable on right, possibly small one on left also; no carotid bruit or JVD   Back:  Spine nontender, no curvature, ROM normal, no CVA tenderness   Lungs:  Clear to auscultation bilaterally without wheezes, rales or ronchi; respirations unlabored   Chest Wall:  No tenderness or deformity   Heart:  Regular rate and rhythm, S1 and S2 normal, no murmur, rub  or gallop   Breast Exam:  No tenderness, masses, or nipple discharge or inversion. No axillary lymphadenopathy   Abdomen:  Soft, non-tender, obese, nondistended, normoactive bowel sounds, no masses, no hepatosplenomegaly   Genitalia:  Normal external genitalia without lesions. BUS and vagina normal; surgically absent uterus. No adnexal masses or tenderness, but limited by body habitus. No pap performed (just bimanual exam)    Rectal:  Normal tone, no masses or tenderness; guaiac negative stool   Extremities:  No clubbing, cyanosis; nonpitting edema at ankles bilaterally   Pulses:  2+ and symmetric all extremities.  2+ PT bilaterally, but absent DP on left, 2+ DP on right.  Feet warm, brisk cap refill.  Skin:  Skin color, texture, turgor normal. Small nonpigmented papules across back and elsewhere, nontender. Many skin tags. Onychomycosis of both feet toenails. L 5th toe--skin nodule. Bruising under left eye; spreading down left cheek, left neck, and yellow ecchymosis central upper chest   Lymph nodes:  Cervical, supraclavicular, and axillary nodes normal   Neurologic:  CNII-XII intact, normal strength, sensation and gait; reflexes 2+ and symmetric throughout          Psych: Normal mood, affect,  hygiene and grooming.     ASSESSMENT/PLAN:  Routine general medical examination at a health care facility - Plan: POCT Urinalysis Dipstick, Visual acuity screening  Essential hypertension, benign - controlled per home numbers, elevated here.  possible white coat component.  continue current med, weight loss, low sodium diet, exercise - Plan: Comprehensive metabolic panel  Unspecified vitamin D deficiency - recheck today.  taking daily multivitamin - Plan: Vit D  25 hydroxy (rtn osteoporosis monitoring)  Bruising - more than expected with recent surgery.  donates blood frequently and has had mild anemia.  now on MVI with iron - Plan: CBC with Differential  Abnormal finding on urinalysis - Plan: POCT UA - Microscopic Only, Urine culture   Discussed monthly self breast exams and yearly mammograms after the age of 74; at least 30 minutes of aerobic activity at least 5 days/week; proper sunscreen use reviewed; healthy diet, including goals of calcium and vitamin D intake and alcohol recommendations (less than or equal to 1 drink/day) reviewed; regular seatbelt use; changing batteries in smoke detectors. Immunization  recommendations discussed--recommend zostavax at age 52.  Get flu shot through school (pt prefers, rather than getting today). Colonoscopy recommendations reviewed--past due; advised to call and schedule.  Hemoccult kit given   HTN--  Bring monitor to next visit to check accuracy.  2+ leuks on dip--micro showed only 0-5 squams, some PMN's.  Will send for culture.  Rare bacteria.  F/u 6 months, sooner prn

## 2013-10-02 LAB — URINE CULTURE
Colony Count: NO GROWTH
Organism ID, Bacteria: NO GROWTH

## 2013-10-02 LAB — VITAMIN D 25 HYDROXY (VIT D DEFICIENCY, FRACTURES): Vit D, 25-Hydroxy: 41 ng/mL (ref 30–89)

## 2013-11-01 ENCOUNTER — Other Ambulatory Visit: Payer: Self-pay

## 2014-04-01 ENCOUNTER — Encounter: Payer: BC Managed Care – PPO | Admitting: Family Medicine

## 2014-04-10 ENCOUNTER — Ambulatory Visit (INDEPENDENT_AMBULATORY_CARE_PROVIDER_SITE_OTHER): Payer: BC Managed Care – PPO | Admitting: Family Medicine

## 2014-04-10 ENCOUNTER — Encounter: Payer: Self-pay | Admitting: Family Medicine

## 2014-04-10 VITALS — BP 148/88 | HR 76 | Ht 67.0 in | Wt 264.0 lb

## 2014-04-10 DIAGNOSIS — I10 Essential (primary) hypertension: Secondary | ICD-10-CM

## 2014-04-10 DIAGNOSIS — Z6841 Body Mass Index (BMI) 40.0 and over, adult: Secondary | ICD-10-CM

## 2014-04-10 NOTE — Patient Instructions (Signed)
  Take multivitamin or a separate vitamin D once daily to help maintain your vitamin D levels.  Take this separate from your thyroid medication.  You need the iron only because of your blood donation.  You can either take a multivitamin with iron daily, or take a separate iron tablet only for a couple of weeks after your donation (or a couple of weeks prior to donation).  Continue weight loss, low sodium diet, and your current medications.

## 2014-04-10 NOTE — Progress Notes (Signed)
Chief Complaint  Patient presents with  . Hypertension    nonfasting med check.   Hypertension follow-up:  Blood pressures elsewhere are 120-130's/high 70's-80.  She checks them at work, but left her list on her desk.  She reports that her monitor was verified as accurate in the past.  Denies dizziness, headaches, chest pain.  Denies side effects of medications.  She has continued to lose weight--is down 16 pounds, back to where she was prior to being on blood pressure medications.  She takes MVI with iron--she reports that she once had Hg 11.9 prior to donating blood (and wasn't able to donate).  Donates blood regularly.  She needs motivation--weight check-ins to keep her on track. She is hoping that she will be able to come off the blood pressure medications with further weight loss.  Past Medical History  Diagnosis Date  . Elevated blood sugar   . Multinodular goiter   . Hypothyroidism     followed by Dr. Chalmers Cater  . Tuberous sclerosis     diagnosed by Dr. Ubaldo Glassing  . Seizure childhood    off medication since age 15  . Hypertension 2014   Past Surgical History  Procedure Laterality Date  . Lasik      monovision (R for distance, L for reading)  . Abdominal hysterectomy  1995    for fibroids (Dr. Ree Edman)  . Breast surgery      cyst removal from right breast.  . Tonsillectomy  age 31  . Varicose vein surgery      Dr. Aleda Grana  . Mouth surgery  08/2013    cyst removed, upper jaw  . Foot surgery      toenails removed and tubers removed   History   Social History  . Marital Status: Single    Spouse Name: N/A    Number of Children: N/A  . Years of Education: N/A   Occupational History  . 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 Smokeless tobacco: Never Used  . Alcohol Use: Yes     Comment: maybe 2-5 times per year.  . Drug Use: No  . Sexual Activity: Not Currently   Other Topics Concern  .  Not on file   Social History Narrative   Single, has 1 dog (toy poodle)    Outpatient Encounter Prescriptions as of 04/10/2014  Medication Sig Note  . amLODipine (NORVASC) 5 MG tablet    . levothyroxine (SYNTHROID, LEVOTHROID) 125 MCG tablet Take 125 mcg by mouth daily.   . Multiple Vitamins-Minerals (MULTIVITAMIN PO) Take 1 tablet by mouth daily. 04/10/2014: MVI with iron--only takes it about twice a week   Allergies  Allergen Reactions  . Adhesive [Tape]   . Lisinopril-Hydrochlorothiazide Rash   ROS:  Denies fevers, chills, URI symptoms, headaches, dizziness, chest pain, palpitations, GI or GU symptoms, joint pains, bleeding, bruising, rash, depression, or other complaints.  PHYSICAL EAM: BP 160/108  Pulse 76  Ht 5\' 7"  (1.702 m)  Wt 264 lb (119.75 kg)  BMI 41.34 kg/m2 148/88 on repeat by MD, large cuff, RA  Well developed, pleasant, obese female in no distress HEENT:  PERRL, EOMI, conjunctiva clear.  OP clear Neck: no lymphadenopathy, thyromegaly, carotid bruit Heart: regular rate and rhythm, no murmur Lungs: clear bilaterally Abdomen: soft, nontender, no organomegaly or mass Extremities: Very trace edema Neuro: alert and oriented, normal cranial nerves, strength, sensation, gait Psych normal mood, affect, hygiene  and grooming  ASSESSMENT/PLAN:  Essential hypertension, benign  Morbid obesity with BMI of 40.0-44.9, adult  HTN is well controlled on her current regimen. BP is higher today, likely has white-coat component. Continue regular monitoring. We reviewed causes of HTN, and although BP will likely improve with weight loss, we discussed why thing people may have HTN, and that it isn't a given that she will be able to get off meds.  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  Take this separate from your  thyroid medication.  You need the iron only because of your blood donation.  You can either take a multivitamin with iron daily, or take a separate iron tablet only for a couple of weeks after your donation (or a couple of weeks prior to donation).  Continue weight loss, low sodium diet, and your current medications.  Refill not needed (each rx lasts 2 mos--has 1 refill left), reminded to fill before 5/24 when it expires.  Pharmacy to call when add'l refills needed.  Continue healthy diet, portion control, try to get at least 30 minutes of exercise daily.  25 min visit, more than 1/2 spent counseling

## 2014-05-15 ENCOUNTER — Other Ambulatory Visit: Payer: Self-pay | Admitting: Family Medicine

## 2014-06-21 ENCOUNTER — Other Ambulatory Visit: Payer: Self-pay | Admitting: Family Medicine

## 2014-06-21 DIAGNOSIS — Z1231 Encounter for screening mammogram for malignant neoplasm of breast: Secondary | ICD-10-CM

## 2014-07-10 ENCOUNTER — Other Ambulatory Visit: Payer: Self-pay

## 2014-07-15 ENCOUNTER — Ambulatory Visit (HOSPITAL_COMMUNITY)
Admission: RE | Admit: 2014-07-15 | Discharge: 2014-07-15 | Disposition: A | Discharge status: Home / Self Care | Payer: BC Managed Care – PPO | Source: Ambulatory Visit | Attending: Family Medicine | Admitting: Family Medicine

## 2014-07-15 DIAGNOSIS — Z1231 Encounter for screening mammogram for malignant neoplasm of breast: Secondary | ICD-10-CM

## 2014-08-09 ENCOUNTER — Other Ambulatory Visit: Payer: Self-pay | Admitting: Endocrinology

## 2014-08-09 DIAGNOSIS — E049 Nontoxic goiter, unspecified: Secondary | ICD-10-CM

## 2014-10-10 ENCOUNTER — Encounter: Payer: Self-pay | Admitting: Family Medicine

## 2014-12-19 ENCOUNTER — Ambulatory Visit (INDEPENDENT_AMBULATORY_CARE_PROVIDER_SITE_OTHER): Payer: BC Managed Care – PPO | Admitting: Family Medicine

## 2014-12-19 ENCOUNTER — Encounter: Payer: Self-pay | Admitting: Family Medicine

## 2014-12-19 VITALS — BP 128/80 | HR 79 | Ht 67.0 in | Wt 265.0 lb

## 2014-12-19 DIAGNOSIS — I10 Essential (primary) hypertension: Secondary | ICD-10-CM

## 2014-12-19 DIAGNOSIS — Z0000 Encounter for general adult medical examination without abnormal findings: Secondary | ICD-10-CM

## 2014-12-19 DIAGNOSIS — R059 Cough, unspecified: Secondary | ICD-10-CM

## 2014-12-19 DIAGNOSIS — R05 Cough: Secondary | ICD-10-CM

## 2014-12-19 LAB — LIPID PANEL
CHOLESTEROL: 147 mg/dL (ref 0–200)
HDL: 46 mg/dL (ref >39)
LDL CALC: 87 mg/dL (ref 0–99)
Total CHOL/HDL Ratio: 3.2 Ratio
Triglycerides: 72 mg/dL (ref <150)
VLDL: 14 mg/dL (ref 0–40)

## 2014-12-19 LAB — POCT URINALYSIS DIPSTICK
Bilirubin, UA: NEGATIVE
Blood, UA: NEGATIVE
Glucose, UA: NEGATIVE
KETONES UA: NEGATIVE
LEUKOCYTES UA: NEGATIVE
Nitrite, UA: NEGATIVE
PROTEIN UA: NEGATIVE
Spec Grav, UA: 1.025
Urobilinogen, UA: NEGATIVE
pH, UA: 6

## 2014-12-19 LAB — COMPREHENSIVE METABOLIC PANEL
ALBUMIN: 4.1 g/dL (ref 3.5–5.2)
ALT: 10 U/L (ref 0–35)
AST: 13 U/L (ref 0–37)
Alkaline Phosphatase: 95 U/L (ref 39–117)
BILIRUBIN TOTAL: 0.4 mg/dL (ref 0.2–1.2)
BUN: 13 mg/dL (ref 6–23)
CHLORIDE: 105 meq/L (ref 96–112)
CO2: 22 meq/L (ref 19–32)
Calcium: 9.1 mg/dL (ref 8.4–10.5)
Creat: 0.68 mg/dL (ref 0.50–1.10)
GLUCOSE: 105 mg/dL — AB (ref 70–99)
Potassium: 3.9 mEq/L (ref 3.5–5.3)
Sodium: 138 mEq/L (ref 135–145)
TOTAL PROTEIN: 7.2 g/dL (ref 6.0–8.3)

## 2014-12-19 NOTE — Patient Instructions (Addendum)
  HEALTH MAINTENANCE RECOMMENDATIONS:  It is recommended that you get at least 30 minutes of aerobic exercise at least 5 days/week (for weight loss, you may need as much as 60-90 minutes). This can be any activity that gets your heart rate up. This can be divided in 10-15 minute intervals if needed, but try and build up your endurance at least once a week.  Weight bearing exercise is also recommended twice weekly.  Eat a healthy diet with lots of vegetables, fruits and fiber.  "Colorful" foods have a lot of vitamins (ie green vegetables, tomatoes, red peppers, etc).  Limit sweet tea, regular sodas and alcoholic beverages, all of which has a lot of calories and sugar.  Up to 1 alcoholic drink daily may be beneficial for women (unless trying to lose weight, watch sugars).  Drink a lot of water.  Calcium recommendations are 1200-1500 mg daily (1500 mg for postmenopausal women or women without ovaries), and vitamin D 1000 IU daily.  This should be obtained from diet and/or supplements (vitamins), and calcium should not be taken all at once, but in divided doses.  Monthly self breast exams and yearly mammograms for women over the age of 74 is recommended.  Sunscreen of at least SPF 30 should be used on all sun-exposed parts of the skin when outside between the hours of 10 am and 4 pm (not just when at beach or pool, but even with exercise, golf, tennis, and yard work!)  Use a sunscreen that says "broad spectrum" so it covers both UVA and UVB rays, and make sure to reapply every 1-2 hours.  Remember to change the batteries in your smoke detectors when changing your clock times in the spring and fall.  Use your seat belt every time you are in a car, and please drive safely and not be distracted with cell phones and texting while driving.  Please call Dr. Amedeo Plenty [facility]) to schedule your routine colonoscopy.  You are due for routine eye exam.  I recommend using Mucinex-DM as needed for cough. Return if  you develop worsening shortness of breath or fever. Use ibuprofen as needed for muscle/rib soreness from coughing. Sleep with the head of the bed elevated.

## 2014-12-19 NOTE — Progress Notes (Signed)
Chief Complaint  Patient presents with  . cpe/ fasting    cpe/ fasting breast and pelvic today. would like to have shingles shot but has not checked with her insuance- she thinks its covered after 60. wants skin checked has she have new moles on the back  . dry cough    dry cough since last friday    April Garcia is a 58 y.o. female who presents for a complete physical and med check.  She has the following concerns:  Cough x 6 days. Started as feeling dry in her throat, different than a usual cold (not a tickle).  It seems worse at night when she is flat.  Better with keeping the head of the bed elevated.  She feels like it is getting better, just taking longer.  Ribs are getting sore from coughing.  Cough is nonproductive.  She has some mild nasal congestion, only occasionally blows, not discolored. No fevers, chills, shortness of breath (just feels like she gasps after a bad spell of coughing).  Hypertension follow-up: Blood pressures elsewhere are 120-130/70's-80, checking once weekly. She reports that her monitor was verified as accurate in the past (wrist). Denies dizziness, headaches, chest pain. Denies side effects of medications. Occasionally her feet swell a little  She occasionally takes MVI with iron--she reports that she once had Hg 11.9 prior to donating blood (and wasn't able to donate). Donates blood regularly, last 2-3 months ago. Hg has been 13 the last few times, hasn't had any low hg in a long time.  Hypothyroidism:  Treated and monitored by Dr. Chalmers Cater, last check was over the summer and dose was not changed.  Plans to have repeat ultrasound next year.   Immunization History  Administered Date(s) Administered  . Influenza Split 09/26/2012  . Td 02/24/2005  . Tdap 09/20/2012  got flu shot last year through the school--she wasn't able to get it this year Last Pap smear: 2007 (s/p hysterectomy) Last mammogram: 06/2014 Last colonoscopy: 2006 with Dr. Amedeo Plenty Last DEXA:  2006 Dentist: every 3 months for cleanings Ophtho: 2-3 years ago Exercise: no regular routine, sporadic.  Walks dog Vitamin D level was normal 09/2013 (41) Lipids 2 years ago were normal  Past Medical History  Diagnosis Date  . Elevated blood sugar   . Multinodular goiter   . Hypothyroidism     followed by Dr. Chalmers Cater  . Tuberous sclerosis     diagnosed by Dr. Ubaldo Glassing  . Seizure childhood    off medication since age 59  . Hypertension 2014    Past Surgical History  Procedure Laterality Date  . Lasik      monovision (R for distance, L for reading)  . Abdominal hysterectomy  1995    for fibroids (Dr. Ree Edman)  . Breast surgery      cyst removal from right breast.  . Tonsillectomy  age 18  . Varicose vein surgery      Dr. Aleda Grana  . Mouth surgery  08/2013    cyst removed, upper jaw  . Foot surgery      toenails removed and tubers removed    History   Social History  . Marital Status: Single    Spouse Name: N/A    Number of Children: N/A  . 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 Smokeless tobacco: Never Used  .  Alcohol Use: Yes     Comment: maybe 2-5 times per year.  . Drug Use: No  . Sexual Activity: Not Currently   Other Topics Concern  . Not on file   Social History Narrative   Single, has 1 dog (toy poodle)--Rosie    Family History  Problem Relation Age of Onset  . Breast cancer Mother 29    stage 0  . Hypothyroidism Mother   . Dementia Father   . Heart disease Father     arrhythmia (cardioverted)  . Colon polyps Father     "precancerous"  . Tuberous sclerosis Father   . Breast cancer Maternal Aunt   . Breast cancer Maternal Aunt   . Stroke Cousin   . Stroke Cousin   . Diabetes Neg Hx   . Colon cancer Cousin 37  . Heart disease Maternal Grandfather     MI in 38's  . Heart disease Paternal Grandfather     MI in  56's    Outpatient Encounter Prescriptions as of 12/19/2014  Medication Sig Note  . amLODipine (NORVASC) 5 MG tablet increase to 2 tablets by mouth daily after 1-2 weeks if blood pressure remains greater than 140/90 12/19/2014: Taking just 1 tablet daily  . levothyroxine (SYNTHROID, LEVOTHROID) 125 MCG tablet Take 125 mcg by mouth daily.   . Multiple Vitamins-Minerals (MULTIVITAMIN PO) Take 1 tablet by mouth daily. 04/10/2014: MVI with iron--only takes it about twice a week  . Vitamin D, Cholecalciferol, 1000 UNITS TABS Take 1 Dose by mouth daily.   . [DISCONTINUED] amLODipine (NORVASC) 5 MG tablet      Allergies  Allergen Reactions  . Adhesive [Tape]   . Lisinopril-Hydrochlorothiazide Rash   ROS: The patient denies anorexia, fever, weight changes, headaches, vision changes, decreased hearing, ear pain, sore throat, breast concerns, chest pain, palpitations, dizziness, syncope, dyspnea on exertion, cough, swelling, nausea, vomiting, diarrhea, constipation, abdominal pain, melena, hematochezia, indigestion/heartburn, hematuria, incontinence, dysuria, vaginal bleeding, discharge, odor or itch, genital lesions, joint pains, numbness, tingling, weakness, tremor, suspicious skin lesions, depression, anxiety, abnormal bleeding/bruising, or enlarged lymph nodes. She has some moles on her back that get a little irritated that she would like checked (she can't see). Hasn't been to the dermatologist in several years.   PHYSICAL EXAM:  BP 128/80 mmHg  Pulse 79  Ht 5\' 7"  (1.702 m)  Wt 265 lb (120.203 kg)  BMI 41.50 kg/m2  General Appearance:   Alert, cooperative, no distress, appears stated age. Intermittent dry cough during visit  Head:   Normocephalic, without obvious abnormality, atraumatic  Eyes:   PERRL, conjunctiva/corneas clear, EOM's intact, fundi   benign  Ears:   Normal TM's and external ear canals  Nose:  Nares normal, mucosa normal, no drainage or sinus tenderness   Throat:  Lips, mucosa, and tongue normal; teeth and gums normal  Neck:  Supple, no lymphadenopathy; Thyroid: Thyroid enlarged, nodule palpable on right, nontender; no carotid bruit or JVD  Back:  Spine nontender, no curvature, ROM normal, no CVA tenderness  Lungs:   Clear to auscultation bilaterally without wheezes, rales or ronchi; respirations unlabored  Chest Wall:   No tenderness or deformity  Heart:   Regular rate and rhythm, S1 and S2 normal, no murmur, rub  or gallop  Breast Exam:   No tenderness, masses, or nipple discharge or inversion. No axillary lymphadenopathy  Abdomen:   Soft, non-tender, obese, nondistended, normoactive bowel sounds, no masses, no hepatosplenomegaly  Genitalia:   Normal external genitalia without lesions.  BUS and vagina normal; surgically absent uterus. No adnexal masses or tenderness. Pap not performed.  Rectal:   Normal tone, no masses or tenderness; guaiac negative stool  Extremities:  No clubbing, cyanosis; nonpitting edema at ankles bilaterally  Pulses:  2+ and symmetric all extremities  Skin:  Skin color, texture, turgor normal. Small nonpigmented papules across back and elsewhere, nontender. Scattered skin tags and SK's also present Onychomycosis of both feet toenails, along with tubers on some toenails  Lymph nodes:  Cervical, supraclavicular, and axillary nodes normal  Neurologic:  CNII-XII intact, normal strength, sensation and gait; reflexes 2+ and symmetric throughout   Psych: Normal mood, affect, hygiene and grooming.  ASSESSMENT/PLAN:  Routine general medical examination at a health care facility - Plan: POCT urinalysis dipstick, Comprehensive metabolic panel, Lipid panel  Essential hypertension - well controlled on current regimen. Weight loss and exercise recommended - Plan: Comprehensive metabolic panel, Lipid panel  Cough - no  evidence of infection/wheezing. continue supportive measures  Severe obesity (BMI >= 40) - counseled re: diet and exercise  Discussed monthly self breast exams and yearly mammograms; at least 30 minutes of aerobic activity at least 5 days/week, weight-bearing exercise 2x/wk; proper sunscreen use reviewed; healthy diet, including goals of calcium and vitamin D intake and alcohol recommendations (less than or equal to 1 drink/day) reviewed; regular seatbelt use; changing batteries in smoke detectors. Immunization recommendations discussed--flu shot and zostavax recommended, see below. Colonoscopy recommendations reviewed--due now, to call Dr. Amedeo Plenty to schedule  Will check with her insurance and return next week for flu shot and zostavax (to be given the same day, while on break from school, rather than giving flu shot today and having to wait a month for the zostavax.)  Cough--supportive measures reviewed

## 2014-12-24 ENCOUNTER — Other Ambulatory Visit (INDEPENDENT_AMBULATORY_CARE_PROVIDER_SITE_OTHER): Payer: BC Managed Care – PPO

## 2014-12-24 DIAGNOSIS — Z23 Encounter for immunization: Secondary | ICD-10-CM

## 2014-12-30 ENCOUNTER — Other Ambulatory Visit: Payer: BC Managed Care – PPO

## 2015-03-11 ENCOUNTER — Other Ambulatory Visit: Payer: Self-pay | Admitting: Family Medicine

## 2015-07-09 ENCOUNTER — Other Ambulatory Visit: Payer: Self-pay | Admitting: Family Medicine

## 2015-07-09 DIAGNOSIS — Z1231 Encounter for screening mammogram for malignant neoplasm of breast: Secondary | ICD-10-CM

## 2015-07-18 ENCOUNTER — Ambulatory Visit (HOSPITAL_COMMUNITY)
Admission: RE | Admit: 2015-07-18 | Discharge: 2015-07-18 | Disposition: A | Discharge status: Home / Self Care | Payer: BC Managed Care – PPO | Source: Ambulatory Visit | Attending: Family Medicine | Admitting: Family Medicine

## 2015-07-18 DIAGNOSIS — Z1231 Encounter for screening mammogram for malignant neoplasm of breast: Secondary | ICD-10-CM

## 2015-08-06 ENCOUNTER — Ambulatory Visit
Admission: RE | Admit: 2015-08-06 | Discharge: 2015-08-06 | Disposition: A | Discharge status: Home / Self Care | Payer: BC Managed Care – PPO | Source: Ambulatory Visit | Attending: Endocrinology | Admitting: Endocrinology

## 2015-08-06 DIAGNOSIS — E049 Nontoxic goiter, unspecified: Secondary | ICD-10-CM

## 2015-11-10 ENCOUNTER — Other Ambulatory Visit: Payer: Self-pay | Admitting: Family Medicine

## 2015-11-10 DIAGNOSIS — I10 Essential (primary) hypertension: Secondary | ICD-10-CM

## 2015-11-11 ENCOUNTER — Telehealth: Payer: Self-pay

## 2015-11-11 NOTE — Telephone Encounter (Signed)
Dr.Knapp it looks like you filled this med 02/2015 with 3 refills so is this okay to refill pt has appointment with you in [place].

## 2015-11-11 NOTE — Telephone Encounter (Signed)
Done (I believe she takes just 1/day)

## 2015-11-13 NOTE — Telephone Encounter (Signed)
error 

## 2015-12-24 ENCOUNTER — Ambulatory Visit (INDEPENDENT_AMBULATORY_CARE_PROVIDER_SITE_OTHER): Payer: BC Managed Care – PPO | Admitting: Family Medicine

## 2015-12-24 ENCOUNTER — Encounter: Payer: Self-pay | Admitting: Family Medicine

## 2015-12-24 VITALS — BP 132/84 | HR 72 | Temp 98.1°F | Ht 68.0 in | Wt 245.0 lb

## 2015-12-24 DIAGNOSIS — Z0000 Encounter for general adult medical examination without abnormal findings: Secondary | ICD-10-CM | POA: Diagnosis not present

## 2015-12-24 DIAGNOSIS — Z23 Encounter for immunization: Secondary | ICD-10-CM | POA: Diagnosis not present

## 2015-12-24 DIAGNOSIS — E559 Vitamin D deficiency, unspecified: Secondary | ICD-10-CM | POA: Diagnosis not present

## 2015-12-24 DIAGNOSIS — Z6835 Body mass index (BMI) 35.0-35.9, adult: Secondary | ICD-10-CM | POA: Diagnosis not present

## 2015-12-24 DIAGNOSIS — I10 Essential (primary) hypertension: Secondary | ICD-10-CM

## 2015-12-24 LAB — POCT URINALYSIS DIPSTICK
Bilirubin, UA: NEGATIVE
Blood, UA: NEGATIVE
GLUCOSE UA: NEGATIVE
KETONES UA: NEGATIVE
Nitrite, UA: NEGATIVE
PROTEIN UA: NEGATIVE
Spec Grav, UA: 1.025
Urobilinogen, UA: NEGATIVE
pH, UA: 6

## 2015-12-24 MED ORDER — AMLODIPINE BESYLATE 5 MG PO TABS: ORAL_TABLET | ORAL | Status: DC

## 2015-12-24 NOTE — Progress Notes (Signed)
Chief Complaint  Patient presents with  . Annual Exam   April Garcia is a 59 y.o. female who presents for a complete physical.  She has the following concerns:  Hypertension follow-up: Blood pressures elsewhere are 115-125/70's at [employer].  Her monitor broke. Denies dizziness, headaches, chest pain. Denies side effects of medications. Occasionally her feet swell a little, which she relates to the medication, worse at the end of the day.  Obesity:  Since mid-July, she started with Dr. Luna Glasgow 20/20 diet--apple/walnuts/greek yogurt for breakfast, spinach salad, stir fry, rice.  Snacks on peanut butter, crackers. She used stationery bike 30 minutes daily over the summer, but not regularly since school started back again. She walks her dog in the morning.  She occasionally takes MVI with iron--within the last few months she wasn't able to donate blood recently due to hemoglobin level, so then regularly started taking Flinstone vitamins with iron, and she was then able to donate again.  She last donated in early November.  Hypothyroidism: Treated and monitored by Dr. Chalmers Cater, last check was over the summer and dose was not changed. Had ultrasound in August: IMPRESSION: Stable heterogeneous thyroid gland without discrete nodules. The right lobe may be minimally larger in size.   Immunization History  Administered Date(s) Administered  . Influenza Split 09/26/2012  . Influenza,inj,Quad PF,36+ Mos 12/24/2014  . Td 02/24/2005  . Tdap 09/20/2012  . Zoster 12/24/2014   Last Pap smear: 2007 (s/p hysterectomy) Last mammogram: 06/2015 Last colonoscopy: 2006 with Dr. Amedeo Plenty (never scheduled last year, as was recommended; reliant on getting a friend to take her) Last DEXA: 2006 Dentist: every 3-6 months for cleanings Ophtho: 3-4 years ago Exercise: Walks dog, exercise bike (not regularly currently) Vitamin D level was normal 09/2013 (41), low (26) in 08/2012 Lipids 3 years ago were  normal  Past Medical History  Diagnosis Date  . Elevated blood sugar   . Multinodular goiter   . Hypothyroidism     followed by Dr. Chalmers Cater  . Tuberous sclerosis ([place])     diagnosed by Dr. Ubaldo Glassing  . Seizure ([place]) childhood    off medication since age 21  . Hypertension 2014    Past Surgical History  Procedure Laterality Date  . Lasik      monovision (R for distance, L for reading)  . Abdominal hysterectomy  1995    for fibroids (Dr. Ree Edman)  . Breast surgery      cyst removal from right breast.  . Tonsillectomy  age 79  . Varicose vein surgery      Dr. Aleda Grana  . Mouth surgery  08/2013    cyst removed, upper jaw  . Foot surgery      toenails removed and tubers removed    Social History   Social History  . Marital Status: Single    Spouse Name: N/A  . Number of Children: N/A  . Years of Education: N/A   Occupational History  . 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 Smokeless tobacco: Never Used  . Alcohol Use: Yes     Comment: maybe 2-5 times per year.  . Drug Use: No  . Sexual Activity: Not Currently   Other Topics Concern  . Not on file   Social History Narrative   Single, has 1 dog (toy poodle)--Rosie    Family History  Problem Relation Age of Onset  . Breast cancer Mother  84    stage 0  . Hypothyroidism Mother     now normal, off meds  . Dementia Mother   . Dementia Father   . Heart disease Father     arrhythmia (cardioverted)  . Colon polyps Father     "precancerous"  . Tuberous sclerosis Father   . Breast cancer Maternal Aunt   . Breast cancer Maternal Aunt   . Stroke Cousin   . Stroke Cousin   . Diabetes Neg Hx   . Colon cancer Cousin 72  . Heart disease Maternal Grandfather     MI in 78's  . Heart disease Paternal Grandfather     MI in 31's    Outpatient Encounter Prescriptions as of 12/24/2015  Medication Sig Note  . amLODipine (NORVASC) 5 MG tablet  TAKE 1 TABLET INCREASE TO 2 TABS DAILY AFTER 1-2 WEEKS IF BLOOD PRESSURE REMAINS >140/90   . levothyroxine (SYNTHROID, LEVOTHROID) 125 MCG tablet Take 125 mcg by mouth daily.   . Multiple Vitamins-Minerals (MULTIVITAMIN PO) Take 1 tablet by mouth daily. 04/10/2014: MVI with iron--only takes it about twice a week  . Vitamin D, Cholecalciferol, 1000 UNITS TABS Take 1 Dose by mouth daily. Reported on 12/24/2015 12/24/2015: Hasn't been taking for several months   No facility-administered encounter medications on file as of 12/24/2015.    Allergies  Allergen Reactions  . Adhesive [Tape]   . Lisinopril-Hydrochlorothiazide Rash   ROS: The patient denies anorexia, fever, headaches, vision changes, decreased hearing, ear pain, sore throat, breast concerns, chest pain, palpitations, dizziness, syncope, dyspnea on exertion, cough, swelling, nausea, vomiting, diarrhea, constipation, abdominal pain, melena, hematochezia, indigestion/heartburn, hematuria, incontinence, dysuria, vaginal bleeding, discharge, odor or itch, genital lesions, joint pains, numbness, tingling, weakness, tremor, suspicious skin lesions, depression, anxiety, abnormal bleeding/bruising, or enlarged lymph nodes. She has a mole on her right back that get a little irritated that she would like checked  Lost 20# since last year.   PHYSICAL EXAM:  BP 134/86 mmHg  Pulse 72  Temp(Src) 98.1 F (36.7 C) (Oral)  Ht 5\' 8"  (1.727 m)  Wt 245 lb (111.131 kg)  BMI 37.26 kg/m2 132/84 on repeat by MD  General Appearance:   Alert, cooperative, no distress, appears stated age. Intermittent dry cough during visit  Head:   Normocephalic, without obvious abnormality, atraumatic  Eyes:   PERRL, conjunctiva/corneas clear, EOM's intact, fundi   benign  Ears:   Normal TM's and external ear canals  Nose:  Nares normal, mucosa normal, no drainage or sinus tenderness  Throat:  Lips, mucosa, and tongue normal; teeth and  gums normal  Neck:  Supple, no lymphadenopathy; Thyroid: Thyroid enlarged on right (?nodule), nontender; no carotid bruit or JVD  Back:  Spine nontender, no curvature, ROM normal, no CVA tenderness  Lungs:   Clear to auscultation bilaterally without wheezes, rales or ronchi; respirations unlabored  Chest Wall:   No tenderness or deformity  Heart:   Regular rate and rhythm, S1 and S2 normal, no murmur, rub  or gallop  Breast Exam:   No tenderness, masses, or nipple discharge or inversion. No axillary lymphadenopathy  Abdomen:   Soft, non-tender, obese, nondistended, normoactive bowel sounds, no masses, no hepatosplenomegaly  Genitalia:   Normal external genitalia without lesions. BUS and vagina normal; surgically absent uterus. No adnexal masses or tenderness. Pap not performed.  Rectal:   Normal tone, no masses or tenderness; guaiac negative stool  Extremities:  No clubbing, cyanosis; nonpitting edema at ankles bilaterally  Pulses:  2+ and symmetric all extremities  Skin:  Skin color, texture, turgor normal. Small nonpigmented papules across back and elsewhere, nontender. Scattered skin tags and SK's also present. She had an inflammed/scabbed skin tag on the left lateral chest wall. Onychomycosis of both feet toenails, along with tubers on L 5th toe and right great toe. Some stasis changes at the left shin (hyperpigmentation)  Lymph nodes:  Cervical, supraclavicular, and axillary nodes normal  Neurologic:  CNII-XII intact, normal strength, sensation and gait; reflexes 2+ and symmetric throughout   Psych: Normal mood, affect, hygiene and grooming.        ASSESSMENT/PLAN:  Encounter for annual physical exam - Plan: Urinalysis Dipstick, Visual acuity screening, CBC with Differential/Platelet, Lipid panel, VITAMIN D 25 Hydroxy (Vit-D Deficiency, Fractures), Comprehensive  metabolic panel  Essential hypertension, benign - controlled; continue current meds, weight loss - Plan: CBC with Differential/Platelet, Lipid panel, Comprehensive metabolic panel, amLODipine (NORVASC) 5 MG tablet  Vitamin D deficiency - continue daily supplement - Plan: VITAMIN D 25 Hydroxy (Vit-D Deficiency, Fractures)  Needs flu shot - Plan: Flu Vaccine QUAD 36+ mos PF IM (Fluarix & Fluzone Quad PF)  Severe obesity (BMI 35.0-35.9 with comorbidity) (HCC) - continue healthy diet; increase exercise, continue weight loss  Discussed monthly self breast exams and yearly mammograms; at least 30 minutes of aerobic activity at least 5 days/week, weight-bearing exercise 2x/wk; proper sunscreen use reviewed; healthy diet, including goals of calcium and vitamin D intake and alcohol recommendations (less than or equal to 1 drink/day) reviewed; regular seatbelt use; changing batteries in smoke detectors. Immunization recommendations discussed, flu shot given today. Colonoscopy recommendations reviewed--past due, to call Dr. Amedeo Plenty to schedule. Also advised to schedule routine eye exam.  She desires to come off blood pressure medication, and is hopeful that will happen if she continues to lose weight.  Discussed to continue her current medication, and contact us if having dizziness or BP's consistently [id number] systolic  Fasting labs this week. F/u 1 year, sooner prn

## 2015-12-24 NOTE — Patient Instructions (Signed)
  HEALTH MAINTENANCE RECOMMENDATIONS:  It is recommended that you get at least 30 minutes of aerobic exercise at least 5 days/week (for weight loss, you may need as much as 60-90 minutes). This can be any activity that gets your heart rate up. This can be divided in 10-15 minute intervals if needed, but try and build up your endurance at least once a week.  Weight bearing exercise is also recommended twice weekly.  Eat a healthy diet with lots of vegetables, fruits and fiber.  "Colorful" foods have a lot of vitamins (ie green vegetables, tomatoes, red peppers, etc).  Limit sweet tea, regular sodas and alcoholic beverages, all of which has a lot of calories and sugar.  Up to 1 alcoholic drink daily may be beneficial for women (unless trying to lose weight, watch sugars).  Drink a lot of water.  Calcium recommendations are 1200-1500 mg daily (1500 mg for postmenopausal women or women without ovaries), and vitamin D 1000 IU daily.  This should be obtained from diet and/or supplements (vitamins), and calcium should not be taken all at once, but in divided doses.  Monthly self breast exams and yearly mammograms for women over the age of 42 is recommended.  Sunscreen of at least SPF 30 should be used on all sun-exposed parts of the skin when outside between the hours of 10 am and 4 pm (not just when at beach or pool, but even with exercise, golf, tennis, and yard work!)  Use a sunscreen that says "broad spectrum" so it covers both UVA and UVB rays, and make sure to reapply every 1-2 hours.  Remember to change the batteries in your smoke detectors when changing your clock times in the spring and fall.  Use your seat belt every time you are in a car, and please drive safely and not be distracted with cell phones and texting while driving.   Please schedule a routine eye exam, as well as contacting Dr. Amedeo Plenty to schedule your colonoscopy.

## 2015-12-25 ENCOUNTER — Other Ambulatory Visit: Payer: BC Managed Care – PPO

## 2015-12-25 DIAGNOSIS — I10 Essential (primary) hypertension: Secondary | ICD-10-CM

## 2015-12-25 DIAGNOSIS — Z0000 Encounter for general adult medical examination without abnormal findings: Secondary | ICD-10-CM

## 2015-12-25 DIAGNOSIS — E559 Vitamin D deficiency, unspecified: Secondary | ICD-10-CM

## 2015-12-25 LAB — CBC WITH DIFFERENTIAL/PLATELET
Basophils Absolute: 0 10*3/uL (ref 0.0–0.1)
Basophils Relative: 0 % (ref 0–1)
EOS PCT: 4 % (ref 0–5)
Eosinophils Absolute: 0.3 10*3/uL (ref 0.0–0.7)
HEMATOCRIT: 35.6 % — AB (ref 36.0–46.0)
Hemoglobin: 11.3 g/dL — ABNORMAL LOW (ref 12.0–15.0)
LYMPHS PCT: 20 % (ref 12–46)
Lymphs Abs: 1.4 10*3/uL (ref 0.7–4.0)
MCH: 25.6 pg — AB (ref 26.0–34.0)
MCHC: 31.7 g/dL (ref 30.0–36.0)
MCV: 80.5 fL (ref 78.0–100.0)
MONO ABS: 0.4 10*3/uL (ref 0.1–1.0)
MONOS PCT: 6 % (ref 3–12)
MPV: 10.3 fL (ref 8.6–12.4)
Neutro Abs: 4.8 10*3/uL (ref 1.7–7.7)
Neutrophils Relative %: 70 % (ref 43–77)
Platelets: 238 10*3/uL (ref 150–400)
RBC: 4.42 MIL/uL (ref 3.87–5.11)
RDW: 15.3 % (ref 11.5–15.5)
WBC: 6.9 10*3/uL (ref 4.0–10.5)

## 2015-12-26 LAB — COMPREHENSIVE METABOLIC PANEL
ALK PHOS: 98 U/L (ref 33–130)
ALT: 11 U/L (ref 6–29)
AST: 11 U/L (ref 10–35)
Albumin: 3.9 g/dL (ref 3.6–5.1)
BILIRUBIN TOTAL: 0.3 mg/dL (ref 0.2–1.2)
BUN: 16 mg/dL (ref 7–25)
CO2: 22 mmol/L (ref 20–31)
CREATININE: 0.63 mg/dL (ref 0.50–0.99)
Calcium: 8.7 mg/dL (ref 8.6–10.4)
Chloride: 108 mmol/L (ref 98–110)
GLUCOSE: 90 mg/dL (ref 65–99)
Potassium: 4.2 mmol/L (ref 3.5–5.3)
SODIUM: 141 mmol/L (ref 135–146)
Total Protein: 6.5 g/dL (ref 6.1–8.1)

## 2015-12-26 LAB — VITAMIN D 25 HYDROXY (VIT D DEFICIENCY, FRACTURES): VIT D 25 HYDROXY: 28 ng/mL — AB (ref 30–100)

## 2015-12-26 LAB — LIPID PANEL
Cholesterol: 143 mg/dL (ref 125–200)
HDL: 62 mg/dL (ref >=46)
LDL CALC: 72 mg/dL (ref <130)
TRIGLYCERIDES: 47 mg/dL (ref <150)
Total CHOL/HDL Ratio: 2.3 Ratio (ref <=5.0)
VLDL: 9 mg/dL (ref <30)

## 2016-07-01 ENCOUNTER — Other Ambulatory Visit: Payer: Self-pay | Admitting: Family Medicine

## 2016-07-01 DIAGNOSIS — Z1231 Encounter for screening mammogram for malignant neoplasm of breast: Secondary | ICD-10-CM

## 2016-07-19 ENCOUNTER — Ambulatory Visit
Admission: RE | Admit: 2016-07-19 | Discharge: 2016-07-19 | Disposition: A | Discharge status: Home / Self Care | Payer: BC Managed Care – PPO | Source: Ambulatory Visit | Attending: Family Medicine | Admitting: Family Medicine

## 2016-07-19 DIAGNOSIS — Z1231 Encounter for screening mammogram for malignant neoplasm of breast: Secondary | ICD-10-CM

## 2016-12-22 NOTE — Progress Notes (Signed)
Chief Complaint  Patient presents with  . Annual Exam    nonfasting annual exam with pelvic. Still having ankle swelling left worse than right.     April Garcia is a 60 y.o. female who presents for a complete physical.  She has the following concerns:  Hypertension follow-up: Blood pressures is running 116-135/70-80's, all taken over the last 3 days with her new wrist monitor.  This morning was higher at home (152/95 on first check after walking the dog, then repeated many times and came down).  She brought new monitor with her today.  Denies dizziness, headaches, chest pain. Denies side effects of medications. Occasionally her feet swell a little, L>R ankle, which she relates to the medication, worse at the end of the day. Has compression socks but hasn't been wearing them.   Obesity: She still does some of the Dr. Luna Glasgow 20/20 diet, but is spending more time with her mother, eating dinner with her, plus a lot of stress-eating.  Mother is 55 with dementia, lives alone.  She took leave of absence from her job last Spring to be with her.  She is there before and after work.  Has a new aide starting this week. (still has apple/walnuts/greek yogurt for breakfast, spinach salad for lunch.  Eating dinner with her mother--frozen dinners, splits with her mother, then snacks on unhealthy snacks, candy).  No longer riding her stationery bike 30 minutes daily. She walks her dog in the morning 15-20 minutes (stop/go).  She takes MVI with iron daily (due to donating blood regularly) She last donated in October, plans to again in near future.  Vitamin D deficiency.  She is now taking a daily supplement.  Due for recheck.  Hypothyroidism: Treated and monitored by Dr. Chalmers Cater, last check was over the summer and dose was not changed. Had ultrasound in August 2016: IMPRESSION: Stable heterogeneous thyroid gland without discrete nodules. The right lobe may be minimally larger in size.   Immunization  History  Administered Date(s) Administered  . Influenza Split 09/26/2012  . Influenza,inj,Quad PF,36+ Mos 12/24/2014, 12/24/2015  . Td 02/24/2005  . Tdap 09/20/2012  . Zoster 12/24/2014  got flu shot at school 10/25/16 Last Pap smear: 2007 (s/p hysterectomy) Last mammogram: 06/2016 Last colonoscopy: 2006 with Dr. Amedeo Plenty Last DEXA: 2006 Dentist: every 3-6 months for cleanings Ophtho: several years ago Exercise: Walks dog, Vitamin D-OH level was 28 in 11/2015 Lipids: Lab Results  Component Value Date   CHOL 143 12/25/2015   HDL 62 12/25/2015   LDLCALC 72 12/25/2015   TRIG 47 12/25/2015   CHOLHDL 2.3 12/25/2015   Past Medical History:  Diagnosis Date  . Elevated blood sugar   . Hypertension 2014  . Hypothyroidism    followed by Dr. Chalmers Cater  . Multinodular goiter   . Seizure ([place]) childhood   off medication since age 31  . Tuberous sclerosis ([place])    diagnosed by Dr. Ubaldo Glassing    Past Surgical History:  Procedure Laterality Date  . ABDOMINAL HYSTERECTOMY  1995   for fibroids (Dr. Ree Edman)  . BREAST SURGERY     cyst removal from right breast.  . FOOT SURGERY     toenails removed and tubers removed  . LASIK     monovision (R for distance, L for reading)  . MOUTH SURGERY  08/2013   cyst removed, upper jaw  . TONSILLECTOMY  age 9  . VARICOSE VEIN SURGERY     Dr. Aleda Grana    Social History  Social History  . Marital status: Single    Spouse name: N/A  . Number of children: N/A  . 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 Smokeless tobacco: Never Used  . Alcohol use Yes     Comment: maybe 2-5 times per year.  . Drug use: No  . Sexual activity: Not Currently   Other Topics Concern  . Not on file   Social History Narrative   Single, has 1 dog (toy poodle)--Rosie    Family History  Problem Relation Age of Onset  . Breast cancer  Mother 18    stage 0  . Hypothyroidism Mother     now normal, off meds  . Dementia Mother   . Dementia Father   . Heart disease Father     arrhythmia (cardioverted)  . Colon polyps Father     "precancerous"  . Tuberous sclerosis Father   . Breast cancer Maternal Aunt   . Breast cancer Maternal Aunt   . Stroke Cousin   . Stroke Cousin   . Colon cancer Cousin 56  . Heart disease Maternal Grandfather     MI in 86's  . Heart disease Paternal Grandfather     MI in 75's  . Diabetes Neg Hx     Outpatient Encounter Prescriptions as of 12/29/2016  Medication Sig Note  . amLODipine (NORVASC) 5 MG tablet Take 1 tablet (5 mg total) by mouth daily.   Marland Kitchen levothyroxine (SYNTHROID, LEVOTHROID) 125 MCG tablet Take 125 mcg by mouth daily.   . Multiple Vitamins-Minerals (MULTIVITAMIN PO) Take 1 tablet by mouth daily. 12/29/2016: Takes Flinstone vitamin daily  . Vitamin D, Cholecalciferol, 1000 UNITS TABS Take 1 Dose by mouth daily. Reported on 12/24/2015   . [DISCONTINUED] amLODipine (NORVASC) 5 MG tablet TAKE 1 TABLET INCREASE TO 2 TABS DAILY AFTER 1-2 WEEKS IF BLOOD PRESSURE REMAINS >140/90    No facility-administered encounter medications on file as of 12/29/2016.     Allergies  Allergen Reactions  . Adhesive [Tape]   . Lisinopril-Hydrochlorothiazide Rash    ROS: The patient denies anorexia, fever, headaches, vision changes, decreased hearing, ear pain, sore throat, breast concerns, chest pain, palpitations, dizziness, syncope, dyspnea on exertion, cough, swelling, nausea, vomiting, diarrhea, constipation, abdominal pain, melena, hematochezia, indigestion/heartburn, hematuria, incontinence, dysuria, vaginal bleeding, discharge, odor or itch, genital lesions, joint pains, numbness, tingling, weakness, tremor, suspicious skin lesions, depression, anxiety, abnormal bleeding/bruising, or enlarged lymph nodes. Occasional itching on the left leg (where stasis changes are present). 18# weight gain in  the last year   PHYSICAL EXAM:  BP (!) 142/86 (BP Location: Right Arm, Patient Position: Sitting, Cuff Size: Normal)   Pulse 80   Ht 5' 7"  (1.702 m)   Wt 263 lb 12.8 oz (119.7 kg)   BMI 41.32 kg/m   Pt's wrist monitor, right wrist: 164/97; 137/86 on repeat, when position of hand was corrected 144/78 on repeat by MD  General Appearance:   Alert, cooperative, no distress, appears stated age.  Head:   Normocephalic, without obvious abnormality, atraumatic  Eyes:   PERRL, conjunctiva/corneas clear, EOM's intact, fundi benign  Ears:   Normal TM's and external ear canals  Nose:  Nares normal, mucosa is mildly edematous with clear mucus and some crusting. No sinustenderness  Throat:  Lips, mucosa, and tongue normal; teeth and gums normal  Neck:  Supple, no lymphadenopathy;  Thyroid: Thyroid enlarged, more prominent on the right, nontender; no carotid bruit or JVD  Back:  Spine nontender, no curvature, ROM normal, no CVA tenderness  Lungs:   Clear to auscultation bilaterally without wheezes, rales or ronchi; respirations unlabored  Chest Wall:   No tenderness or deformity  Heart:   Regular rate and rhythm, S1 and S2 normal, no murmur, rub  or gallop  Breast Exam:   No tenderness, masses, or nipple discharge or inversion. No axillary lymphadenopathy  Abdomen:   Soft, non-tender, obese, nondistended, normoactive bowel sounds, no masses, no hepatosplenomegaly  Genitalia:   Normal external genitalia without lesions. BUS and vagina normal; surgically absent uterus. No adnexal masses or tenderness. Pap not performed.  Rectal:   Normal tone, no masses or tenderness; guaiac positive, light brown stool  Extremities:  No clubbing, cyanosis; nonpitting edema at ankles bilaterally  Pulses:  2+ and symmetric all extremities  Skin:  Skin color, texture, turgor normal. Small nonpigmented papules across  back and elsewhere, nontender. Scattered skin tags and SK's also present.  Onychomycosis of both feet toenails, along with tubers on L 5th toe and right great toe. Some stasis changes at the left shin (hyperpigmentation), minimal on the right shin  Lymph nodes:  Cervical, supraclavicular, and axillary nodes normal  Neurologic:  CNII-XII intact, normal strength, sensation and gait; reflexes 2+ and symmetric throughout  Psych: Normal mood, affect, hygiene and grooming.   ASSESSMENT/PLAN:  Annual physical exam - Plan: POCT Urinalysis Dipstick, Visual acuity screening, CBC with Differential/Platelet, Comprehensive metabolic panel, VITAMIN D 25 Hydroxy (Vit-D Deficiency, Fractures), Hemoglobin A1c  Vitamin D deficiency - Plan: VITAMIN D 25 Hydroxy (Vit-D Deficiency, Fractures)  Morbid obesity with BMI of 40.0-44.9, adult ([place]) - counseled re: diet, exercise, healthy snacks, stress reduction - Plan: Hemoglobin A1c  Essential hypertension, benign - borderline control; low sodium diet, regular exercise and weight loss recommended - Plan: Comprehensive metabolic panel, amLODipine (NORVASC) 5 MG tablet  Heme positive stool - past due for colonoscopy.  Pt plans to call Dr. Starr Sinclair to schedule  Essential hypertension, benign - Plan: Comprehensive metabolic panel, amLODipine (NORVASC) 5 MG tablet  CBC, c-met (nonfasting), A1c, Vit D   Discussed monthly self breast exams and yearly mammograms; at least 30 minutes of aerobic activity at least 5 days/week, weight-bearing exercise 2x/wk; proper sunscreen use reviewed; healthy diet, including goals of calcium and vitamin D intake and alcohol recommendations (less than or equal to 1 drink/day) reviewed; regular seatbelt use; changing batteries in smoke detectors. Immunization recommendations discussed--recommended Shingrix when available. Colonoscopy recommendations reviewed--past due, to call Dr. Amedeo Plenty to schedule. Also  advised to schedule routine eye exam.   Please try and wear compression stockings/socks daily. Limit the salt/sodium in the diet. Keep the legs elevated when just lounging around. Use hydrocortisone cream as needed for rash or itching.  Please schedule your colonoscopy, and a routine eye exam.  F/u 3 mos for NV-- BP and weight check Med check in 6 months

## 2016-12-29 ENCOUNTER — Ambulatory Visit (INDEPENDENT_AMBULATORY_CARE_PROVIDER_SITE_OTHER): Payer: BC Managed Care – PPO | Admitting: Family Medicine

## 2016-12-29 ENCOUNTER — Encounter: Payer: Self-pay | Admitting: Family Medicine

## 2016-12-29 VITALS — BP 142/86 | HR 80 | Ht 67.0 in | Wt 263.8 lb

## 2016-12-29 DIAGNOSIS — R195 Other fecal abnormalities: Secondary | ICD-10-CM | POA: Diagnosis not present

## 2016-12-29 DIAGNOSIS — Z6841 Body Mass Index (BMI) 40.0 and over, adult: Secondary | ICD-10-CM | POA: Diagnosis not present

## 2016-12-29 DIAGNOSIS — Z0000 Encounter for general adult medical examination without abnormal findings: Secondary | ICD-10-CM | POA: Diagnosis not present

## 2016-12-29 DIAGNOSIS — I10 Essential (primary) hypertension: Secondary | ICD-10-CM

## 2016-12-29 DIAGNOSIS — E559 Vitamin D deficiency, unspecified: Secondary | ICD-10-CM | POA: Diagnosis not present

## 2016-12-29 LAB — CBC WITH DIFFERENTIAL/PLATELET
Basophils Absolute: 0 cells/uL (ref 0–200)
Basophils Relative: 0 %
EOS PCT: 2 %
Eosinophils Absolute: 182 cells/uL (ref 15–500)
HCT: 38.5 % (ref 35.0–45.0)
Hemoglobin: 11.8 g/dL (ref 11.7–15.5)
LYMPHS PCT: 20 %
Lymphs Abs: 1820 cells/uL (ref 850–3900)
MCH: 25.2 pg — ABNORMAL LOW (ref 27.0–33.0)
MCHC: 30.6 g/dL — AB (ref 32.0–36.0)
MCV: 82.3 fL (ref 80.0–100.0)
MONOS PCT: 6 %
MPV: 10.3 fL (ref 7.5–12.5)
Monocytes Absolute: 546 cells/uL (ref 200–950)
Neutro Abs: 6552 cells/uL (ref 1500–7800)
Neutrophils Relative %: 72 %
PLATELETS: 303 10*3/uL (ref 140–400)
RBC: 4.68 MIL/uL (ref 3.80–5.10)
RDW: 15 % (ref 11.0–15.0)
WBC: 9.1 10*3/uL (ref 4.0–10.5)

## 2016-12-29 LAB — COMPREHENSIVE METABOLIC PANEL
ALT: 12 U/L (ref 6–29)
AST: 14 U/L (ref 10–35)
Albumin: 4.5 g/dL (ref 3.6–5.1)
Alkaline Phosphatase: 103 U/L (ref 33–130)
BUN: 17 mg/dL (ref 7–25)
CALCIUM: 9.3 mg/dL (ref 8.6–10.4)
CO2: 25 mmol/L (ref 20–31)
CREATININE: 0.71 mg/dL (ref 0.50–0.99)
Chloride: 104 mmol/L (ref 98–110)
GLUCOSE: 96 mg/dL (ref 65–99)
Potassium: 3.9 mmol/L (ref 3.5–5.3)
SODIUM: 139 mmol/L (ref 135–146)
Total Bilirubin: 0.4 mg/dL (ref 0.2–1.2)
Total Protein: 7.6 g/dL (ref 6.1–8.1)

## 2016-12-29 LAB — HEMOGLOBIN A1C
Hgb A1c MFr Bld: 5.3 % (ref <5.7)
MEAN PLASMA GLUCOSE: 105 mg/dL

## 2016-12-29 LAB — POCT URINALYSIS DIPSTICK
Bilirubin, UA: NEGATIVE
Glucose, UA: NEGATIVE
Ketones, UA: NEGATIVE
NITRITE UA: NEGATIVE
PROTEIN UA: NEGATIVE
RBC UA: NEGATIVE
SPEC GRAV UA: 1.01
UROBILINOGEN UA: NEGATIVE
pH, UA: 6

## 2016-12-29 MED ORDER — AMLODIPINE BESYLATE 5 MG PO TABS: 5.0000 mg | ORAL_TABLET | Freq: Every day | ORAL | 1 refills | Status: DC

## 2016-12-29 NOTE — Patient Instructions (Addendum)
  HEALTH MAINTENANCE RECOMMENDATIONS:  It is recommended that you get at least 30 minutes of aerobic exercise at least 5 days/week (for weight loss, you may need as much as 60-90 minutes). This can be any activity that gets your heart rate up. This can be divided in 10-15 minute intervals if needed, but try and build up your endurance at least once a week.  Weight bearing exercise is also recommended twice weekly.  Eat a healthy diet with lots of vegetables, fruits and fiber.  "Colorful" foods have a lot of vitamins (ie green vegetables, tomatoes, red peppers, etc).  Limit sweet tea, regular sodas and alcoholic beverages, all of which has a lot of calories and sugar.  Up to 1 alcoholic drink daily may be beneficial for women (unless trying to lose weight, watch sugars).  Drink a lot of water.  Calcium recommendations are 1200-1500 mg daily (1500 mg for postmenopausal women or women without ovaries), and vitamin D 1000 IU daily.  This should be obtained from diet and/or supplements (vitamins), and calcium should not be taken all at once, but in divided doses.  Monthly self breast exams and yearly mammograms for women over the age of 49 is recommended.  Sunscreen of at least SPF 30 should be used on all sun-exposed parts of the skin when outside between the hours of 10 am and 4 pm (not just when at beach or pool, but even with exercise, golf, tennis, and yard work!)  Use a sunscreen that says "broad spectrum" so it covers both UVA and UVB rays, and make sure to reapply every 1-2 hours.  Remember to change the batteries in your smoke detectors when changing your clock times in the spring and fall.  Use your seat belt every time you are in a car, and please drive safely and not be distracted with cell phones and texting while driving.   Please try and wear compression stockings/socks daily. Limit the salt/sodium in the diet. Keep the legs elevated when just lounging around. Use hydrocortisone cream  as needed for rash or itching.  Please schedule your colonoscopy (microscopic blood was noted in the stool test done at your visit today), and a routine eye exam.  It is very important that you get back to a regular exercise routine (150 minutes each week of aerobic exercise, and weight-bearing exercise at least 2 times/week). It is very important that you lose weight. You need to really work on techniques to help prevent stress-eating (stress-reduction; avoiding the stores where you may be tempted--try and do your grocery shopping online, or definitely avoid going to the store when you are hungry!)

## 2016-12-30 LAB — VITAMIN D 25 HYDROXY (VIT D DEFICIENCY, FRACTURES): Vit D, 25-Hydroxy: 41 ng/mL (ref 30–100)

## 2017-03-29 ENCOUNTER — Other Ambulatory Visit: Payer: BC Managed Care – PPO

## 2017-04-19 LAB — HM COLONOSCOPY

## 2017-05-03 ENCOUNTER — Encounter: Payer: Self-pay | Admitting: Family Medicine

## 2017-05-04 ENCOUNTER — Encounter: Payer: Self-pay | Admitting: Family Medicine

## 2017-07-04 ENCOUNTER — Encounter: Payer: BC Managed Care – PPO | Admitting: Family Medicine

## 2017-07-05 ENCOUNTER — Other Ambulatory Visit: Payer: Self-pay | Admitting: Family Medicine

## 2017-07-05 DIAGNOSIS — I10 Essential (primary) hypertension: Secondary | ICD-10-CM

## 2017-07-25 ENCOUNTER — Other Ambulatory Visit: Payer: Self-pay | Admitting: Family Medicine

## 2017-07-25 DIAGNOSIS — Z1231 Encounter for screening mammogram for malignant neoplasm of breast: Secondary | ICD-10-CM

## 2017-08-03 ENCOUNTER — Encounter: Payer: BC Managed Care – PPO | Admitting: Family Medicine

## 2017-08-04 ENCOUNTER — Ambulatory Visit
Admission: RE | Admit: 2017-08-04 | Discharge: 2017-08-04 | Disposition: A | Discharge status: Home / Self Care | Payer: BC Managed Care – PPO | Source: Ambulatory Visit | Attending: Family Medicine | Admitting: Family Medicine

## 2017-08-04 DIAGNOSIS — Z1231 Encounter for screening mammogram for malignant neoplasm of breast: Secondary | ICD-10-CM

## 2017-10-01 ENCOUNTER — Other Ambulatory Visit: Payer: Self-pay | Admitting: Family Medicine

## 2017-10-01 DIAGNOSIS — I10 Essential (primary) hypertension: Secondary | ICD-10-CM

## 2017-10-03 NOTE — Telephone Encounter (Signed)
Is this okay to refill? She has not been seen since Jan and was due for med check in July and not scheduled again until next April.

## 2017-10-03 NOTE — Telephone Encounter (Signed)
Left message asking patient to please call and schedule med check in the next 30 days and letting her know that I can only fill #30.

## 2017-10-03 NOTE — Telephone Encounter (Signed)
She needs a med check scheduled--looks like she canceled them. Ok to fill #30 only and schedule

## 2017-10-09 NOTE — Progress Notes (Signed)
Chief Complaint  Patient presents with  . Hypertension    nonfasting hypertension follow up.   Occasionally she will get some food stuck in her throat.  It hasn't happened in the last month, just occasionally.  Can't recall what she was eating at the time.  People in her family have had to have esophageal dilation. She denies any reflux symptoms.  Hypertension follow-up. She was last seen at her physical in January, where her BP was borderline, and weight was up 18# in the last year.  She was asked to return for NV in 3 months for weight and blood pressure check, and f/u here in 6 months.  It is now 9 months later. She is scheduled for physical in April.  Blood pressures is running 110-130/66-80 at home. If rushed it is higher (130/80), and lower when she rechecks it a few minutes later.  It was 115/70 at home this morning on her wrist monitor.  She reports we have verified this in the past.  Denies dizziness, headaches, chest pain. Denies side effects of medications. Ankles swell some, chronic..  At her last visit we discussed low sodium diet, compression stockings and regular exercise.   She has a pair at home, but hasn't been wearing them. Denies any significant change to the discoloration on her lower legs.  She had colonoscopy in April with Dr. Amedeo Plenty.  There was a hyperplastic polyp, but the prep was very poor.  She was asked to return in 6 months. She is looking for someone to drive her and plans to call to reschedule.  Obesity: She still does some of the Dr. Luna Glasgow 20/20 diet, as she liked those foods--still has apple/walnuts/greek yogurt for breakfast, spinach salads.  While her weight is up today, she reports she actually has been losing. She reports that on 08/02/17 she was 282# at her house.  She was 268# at home today No longer at school, so no longer exposed to candy/snacks. She is no longer buying ice cream. No longer snacking. She feels a lot better since she stopped eating junk.     She does water aerobics 3 days/week, but not in the last month.  She plans to change to doing it at the [facility].  She rides her stationery bike only sporadically. She walks her dog in the morning 15-20 minutes (stop/go, not aerobic).  She visits her mother twice daily (morning and evening, she has a caregiver during the day). Still eats with mom, but bringing her own food, salads, as her mother really isn't aware or care about the food.  Vitamin D deficiency.  She last had level checked in January, and it was normal at 41.  She continues to take a daily supplement.    Hypothyroidism: Treated and monitored by Dr. Chalmers Cater, has an upcoming appointment later this month  PMH, [place], [place] reviewed   Outpatient Encounter Prescriptions as of 10/10/2017  Medication Sig Note  . amLODipine (NORVASC) 5 MG tablet TAKE ONE TABLET BY MOUTH DAILY   . levothyroxine (SYNTHROID, LEVOTHROID) 125 MCG tablet Take 125 mcg by mouth daily.   . Multiple Vitamins-Minerals (MULTIVITAMIN PO) Take 1 tablet by mouth daily. 12/29/2016: Takes Flinstone vitamin daily  . Vitamin D, Cholecalciferol, 1000 UNITS TABS Take 1 Dose by mouth daily. Reported on 12/24/2015    No facility-administered encounter medications on file as of 10/10/2017.    ROS:  No fever, chills, URI symptoms, headaches, dizziness, chest pain, GI complaints. Some ankle swelling and skin discoloration in  lower legs; denies weeping of skin or pain.  Denies shortness of breath, joint pains.   PHYSICAL EXAM:  BP 130/88 (BP Location: Left Arm, Patient Position: Sitting, Cuff Size: Normal)   Pulse 80   Ht 5' 7.5" (1.715 m)   Wt 270 lb 9.6 oz (122.7 kg)   BMI 41.76 kg/m   Wt Readings from Last 3 Encounters:  10/10/17 270 lb 9.6 oz (122.7 kg)  12/29/16 263 lb 12.8 oz (119.7 kg)  12/24/15 245 lb (111.1 kg)   Well developed, pleasant, obese female in good spirits HEENT: conjunctiva and sclera are clear, EOMI Neck: no lymphadenopathy, thyromegaly or  mass Heart: regular rate and rhythm Lungs: clear bilaterally Abdomen: soft, obese, nontender, no organomegaly or mass Extremities: trace pitting edema at top of foot.  L shin--thickened, discolored, dry skin.  Only minimal discoloration on the right Psych: normal mood, affect, hygiene and grooming Neuro: alert and oriented, cranial nerves intact, normal gait   ASSESSMENT/PLAN:  Essential hypertension, benign - borderline/high here.  normal at home; encouraged low sodium diet, increased exercise, weight loss.  To bring monitor again to next visit  Need for influenza vaccination - Plan: Flu Vaccine QUAD 6+ mos PF IM (Fluarix Quad PF)  Morbid obesity with BMI of 40.0-44.9, adult ([place]) - reviewed diet--improved with significant weight loss since August, still well above where she was in the last 1-2 yrs. Cont healthy diet, exercise  Dysphagia, unspecified type - intermittent; doubt stricture. Advised to eat slowly, chew well, drink fluids. f/u if increasing frequency/severity  Venous stasis dermatitis of left lower extremity  Counseled extensively re: diet, portions, exercise, risks of obesity. Borderline BP here--low sodium diet reviewed Counseled re: edema, sodium intake, venous stasis--strongly encouraged daily use of compression stockings.  Shingrix recommended--to check insurance prior to CPE    Resume water aerobics 3x/week, and try and do 30 minutes on your exercise bike at least 2 other days/week.  You have venous insufficiency with stasis changes.  I highly recommend wearing compression stockings daily, putting them on first thing in the morning.  Bring your blood pressure monitor with you to you physical so we can recheck the accuracy.  Keep up the healthy diet and weight loss.  Contact us sooner if you have any problems before your physical.

## 2017-10-10 ENCOUNTER — Encounter: Payer: Self-pay | Admitting: Family Medicine

## 2017-10-10 ENCOUNTER — Ambulatory Visit (INDEPENDENT_AMBULATORY_CARE_PROVIDER_SITE_OTHER): Payer: BC Managed Care – PPO | Admitting: Family Medicine

## 2017-10-10 VITALS — BP 130/88 | HR 80 | Ht 67.5 in | Wt 270.6 lb

## 2017-10-10 DIAGNOSIS — Z23 Encounter for immunization: Secondary | ICD-10-CM | POA: Diagnosis not present

## 2017-10-10 DIAGNOSIS — Z6841 Body Mass Index (BMI) 40.0 and over, adult: Secondary | ICD-10-CM

## 2017-10-10 DIAGNOSIS — R1310 Dysphagia, unspecified: Secondary | ICD-10-CM | POA: Diagnosis not present

## 2017-10-10 DIAGNOSIS — I872 Venous insufficiency (chronic) (peripheral): Secondary | ICD-10-CM

## 2017-10-10 DIAGNOSIS — I10 Essential (primary) hypertension: Secondary | ICD-10-CM

## 2017-10-10 NOTE — Patient Instructions (Signed)
  Resume water aerobics 3x/week, and try and do 30 minutes on your exercise bike at least 2 other days/week.  You have venous insufficiency with stasis changes.  I highly recommend wearing compression stockings daily, putting them on first thing in the morning.  Bring your blood pressure monitor with you to you physical so we can recheck the accuracy.  Keep up the healthy diet and weight loss.  Contact us sooner if you have any problems before your physical.  I recommend getting the new shingles vaccine (Shingrix). You will need to check with your insurance to see if it is covered, and if covered by Medicare Part D, you need to get from the pharmacy rather than our office.  It is a series of 2 injections, spaced 2 months apart.  Check with your insurance before your physical if the Shingrix is covered, so we can start it when you are here.

## 2017-10-30 ENCOUNTER — Other Ambulatory Visit: Payer: Self-pay | Admitting: Family Medicine

## 2017-10-30 DIAGNOSIS — I10 Essential (primary) hypertension: Secondary | ICD-10-CM

## 2018-01-31 NOTE — Progress Notes (Signed)
Chief Complaint  Patient presents with  . Advice Only    1/9 went to donate blood and Hgb was low 6.8. Took some vitamins and went again Sunday at it went up to 7.7. Has been tired and having some swelling at her ankles and well as some SOB.    Gradually has been feeling more tired, falling asleep easily.  "I don't feel 100%", but denies feeling lightheaded.  She has h/o anemia when in HS, recalls feeling LH with standing, but denies feeling that now.  Feeling more winded when walking her dog in the morning. First noticed this a little over a month ago, in December.  She used to donate blood, last time was about a year ago (sometimes it was too low in one hand, fine in the other, in the 12 range).  Denies any vaginal bleeding, nosebleeds, hemorrhoidal bleeding or any other known bleeding.  Denies black stools, hematuria. Denies abdominal pain.  Lab Results  Component Value Date   WBC 9.1 12/29/2016   HGB 7.0 (A) 02/01/2018   HCT 38.5 12/29/2016   MCV 82.3 12/29/2016   PLT 303 12/29/2016    She had colonoscopy in 03/2017 with Dr. Amedeo Plenty.  There was a hyperplastic polyp. It was a poor prep, with incomplete visualization, so it was recommended that she return in 6 months for another colonoscopy (with different prep).  At her visit in October, she had mentioned some intermittent dysphagia (food being stuck, better when she drinks water and eats slower).  No significant change since then, is only intermittent.  Hypertension: Blood pressures is running 110-130/66-80 at home. Higher on first check, if rushed, repeat 15 minutes later is consistently in the 115 range).  Denies dizziness, headaches, chest pain. Denies side effects of medications. Ankles swell some, chronic, not any worse..  PMH, PSH, [place] reviewed  Outpatient Encounter Medications as of 02/01/2018  Medication Sig Note  . amLODipine (NORVASC) 5 MG tablet TAKE ONE TABLET BY MOUTH DAILY   . levothyroxine (SYNTHROID, LEVOTHROID) 125  MCG tablet Take 125 mcg by mouth daily.   . Multiple Vitamins-Minerals (MULTIVITAMIN PO) Take 1 tablet by mouth daily. 12/29/2016: Takes Flinstone vitamin daily  . Vitamin D, Cholecalciferol, 1000 UNITS TABS Take 1 Dose by mouth daily. Reported on 12/24/2015    No facility-administered encounter medications on file as of 02/01/2018.    Allergies  Allergen Reactions  . Adhesive [Tape]   . Lisinopril-Hydrochlorothiazide Rash   ROS:  +fatigue, DOE. Denies fever, chills, URI symptoms, headache, dizziness, chest pain, bleeding ,bruising, rash, GI or GU complaints. Sleeps well, moods good. No other complaints.  PHYSICAL EXAM:  BP 114/70   Pulse 72   Ht _0  (1.702 m)   Wt 270 lb 6.4 oz (122.7 kg)   BMI 42.35 kg/m   Wt Readings from Last 3 Encounters:  02/01/18 270 lb 6.4 oz (122.7 kg)  10/10/17 270 lb 9.6 oz (122.7 kg)  12/29/16 263 lb 12.8 oz (119.7 kg)    Pale, but otherwise pleasant female in no distress HEENT: conjunctiva and sclera are clear, anicteric. OP is clear, moist mucus membranes Neck: no lymphadenopathy, thyromegaly or mass Heart: regular rate and rhythm, no tachycardia Lungs: clear bilaterally Abdomen: soft, nontender, no organomegaly or mass Extremities: Trace pretibial edema Skin: pale. Normal turgor, no rash Psych: normal mood, affect, hygiene and grooming Neuro: alert and oriented, cranial nerves intact, normal gait  ASSESSMENT/PLAN:  Anemia, unspecified type - symptomatic with mild DOE; not tachycardic, no chest pain  or dizziness. Needs GI eval, transfusion - Plan: CBC with Differential/Platelet, Comprehensive metabolic panel, Ferritin, Iron  Low hemoglobin - Plan: Hemoglobin  Essential hypertension, benign - Plan: Comprehensive metabolic panel  GI would not see (Dr. Amedeo Plenty replacement) with Hg <7; we will arrange for transfusion at infusion center, and they will see her in f/u   CBC, Fe, ferritin Add on B12/folate if no iron deficiency. c-met today (last  was a year ago) She is not fasting. Scheduled for physical in April

## 2018-02-01 ENCOUNTER — Encounter: Payer: Self-pay | Admitting: Family Medicine

## 2018-02-01 ENCOUNTER — Ambulatory Visit: Payer: BC Managed Care – PPO | Admitting: Family Medicine

## 2018-02-01 VITALS — BP 114/70 | HR 72 | Ht 67.0 in | Wt 270.4 lb

## 2018-02-01 DIAGNOSIS — I10 Essential (primary) hypertension: Secondary | ICD-10-CM | POA: Diagnosis not present

## 2018-02-01 DIAGNOSIS — D649 Anemia, unspecified: Secondary | ICD-10-CM | POA: Diagnosis not present

## 2018-02-01 LAB — COMPREHENSIVE METABOLIC PANEL
ALT: 10 IU/L (ref 0–32)
AST: 11 IU/L (ref 0–40)
Albumin/Globulin Ratio: 1.8 (ref 1.2–2.2)
Albumin: 4.2 g/dL (ref 3.6–4.8)
Alkaline Phosphatase: 107 IU/L (ref 39–117)
BILIRUBIN TOTAL: 0.3 mg/dL (ref 0.0–1.2)
BUN/Creatinine Ratio: 20 (ref 12–28)
BUN: 13 mg/dL (ref 8–27)
CHLORIDE: 104 mmol/L (ref 96–106)
CO2: 22 mmol/L (ref 20–29)
Calcium: 9 mg/dL (ref 8.7–10.3)
Creatinine, Ser: 0.66 mg/dL (ref 0.57–1.00)
GFR calc non Af Amer: 94 mL/min/{1.73_m2} (ref >59)
GFR, EST AFRICAN AMERICAN: 109 mL/min/{1.73_m2} (ref >59)
GLUCOSE: 97 mg/dL (ref 65–99)
Globulin, Total: 2.3 g/dL (ref 1.5–4.5)
Potassium: 4.6 mmol/L (ref 3.5–5.2)
Sodium: 137 mmol/L (ref 134–144)
Total Protein: 6.5 g/dL (ref 6.0–8.5)

## 2018-02-01 LAB — CBC WITH DIFFERENTIAL/PLATELET
BASOS ABS: 0 10*3/uL (ref 0.0–0.2)
Basos: 0 %
EOS (ABSOLUTE): 0.2 10*3/uL (ref 0.0–0.4)
Eos: 3 %
Hematocrit: 24.2 % — ABNORMAL LOW (ref 34.0–46.6)
Hemoglobin: 6.7 g/dL — CL (ref 11.1–15.9)
IMMATURE GRANS (ABS): 0 10*3/uL (ref 0.0–0.1)
IMMATURE GRANULOCYTES: 0 %
LYMPHS: 19 %
Lymphocytes Absolute: 1.3 10*3/uL (ref 0.7–3.1)
MCH: 17.9 pg — AB (ref 26.6–33.0)
MCHC: 27.7 g/dL — ABNORMAL LOW (ref 31.5–35.7)
MCV: 65 fL — ABNORMAL LOW (ref 79–97)
Monocytes Absolute: 0.6 10*3/uL (ref 0.1–0.9)
Monocytes: 9 %
NEUTROS ABS: 4.8 10*3/uL (ref 1.4–7.0)
NEUTROS PCT: 69 %
PLATELETS: 276 10*3/uL (ref 150–379)
RBC: 3.74 x10E6/uL — AB (ref 3.77–5.28)
RDW: 18.8 % — ABNORMAL HIGH (ref 12.3–15.4)
WBC: 6.9 10*3/uL (ref 3.4–10.8)

## 2018-02-01 LAB — IRON: Iron: 11 ug/dL — ABNORMAL LOW (ref 27–139)

## 2018-02-01 LAB — FERRITIN: FERRITIN: 12 ng/mL — AB (ref 15–150)

## 2018-02-01 LAB — POCT HEMOGLOBIN: HEMOGLOBIN: 7 g/dL — AB (ref 12.2–16.2)

## 2018-02-01 NOTE — Patient Instructions (Signed)
We are checking some stat labs today. You are very anemic, and we need to figure out why. If the tests show iron deficiency, we will get you into see Dr  Amedeo Plenty as soon as we can. If it isn't iron deficient, we likely will send you to the hematologist. You may need to have a transfusion.  This will help you feel better much faster than simply taking iron. In the meantime, I recommend taking iron three times daily with meals (ferrous sulfate, usually is 324 or 368m, may say 662mor something like that of elemental iron), and drinking a LOT of fluids.   Iron can be very constipating--consider taking stool softener along with it.   Anemia Anemia is a condition in which you do not have enough red blood cells or hemoglobin. Hemoglobin is a substance in red blood cells that carries oxygen. When you do not have enough red blood cells or hemoglobin (are anemic), your body cannot get enough oxygen and your organs may not work properly. As a result, you may feel very tired or have other problems. What are the causes? Common causes of anemia include:  Excessive bleeding. Anemia can be caused by excessive bleeding inside or outside the body, including bleeding from the intestine or from periods in women.  Poor nutrition.  Long-lasting (chronic) kidney, thyroid, and liver disease.  Bone marrow disorders.  Cancer and treatments for cancer.  HIV (human immunodeficiency virus) and AIDS (acquired immunodeficiency syndrome).  Treatments for HIV and AIDS.  Spleen problems.  Blood disorders.  Infections, medicines, and autoimmune disorders that destroy red blood cells.  What are the signs or symptoms? Symptoms of this condition include:  Minor weakness.  Dizziness.  Headache.  Feeling heartbeats that are irregular or faster than normal (palpitations).  Shortness of breath, especially with exercise.  Paleness.  Cold sensitivity.  Indigestion.  Nausea.  Difficulty  sleeping.  Difficulty concentrating.  Symptoms may occur suddenly or develop slowly. If your anemia is mild, you may not have symptoms. How is this diagnosed? This condition is diagnosed based on:  Blood tests.  Your medical history.  A physical exam.  Bone marrow biopsy.  Your health care provider may also check your stool (feces) for blood and may do additional testing to look for the cause of your bleeding. You may also have other tests, including:  Imaging tests, such as a CT scan or MRI.  Endoscopy.  Colonoscopy.  How is this treated? Treatment for this condition depends on the cause. If you continue to lose a lot of blood, you may need to be treated at a hospital. Treatment may include:  Taking supplements of iron, vitamin B1N40or folic acid.  Taking a hormone medicine (erythropoietin) that can help to stimulate red blood cell growth.  Having a blood transfusion. This may be needed if you lose a lot of blood.  Making changes to your diet.  Having surgery to remove your spleen.  Follow these instructions at home:  Take over-the-counter and prescription medicines only as told by your health care provider.  Take supplements only as told by your health care provider.  Follow any diet instructions that you were given.  Keep all follow-up visits as told by your health care provider. This is important. Contact a health care provider if:  You develop new bleeding anywhere in the body. Get help right away if:  You are very weak.  You are short of breath.  You have pain in your abdomen or  chest.  You are dizzy or feel faint.  You have trouble concentrating.  You have bloody or black, tarry stools.  You vomit repeatedly or you vomit up blood. Summary  Anemia is a condition in which you do not have enough red blood cells or enough of a substance in your red blood cells that carries oxygen (hemoglobin).  Symptoms may occur suddenly or develop  slowly.  If your anemia is mild, you may not have symptoms.  This condition is diagnosed with blood tests as well as a medical history and physical exam. Other tests may be needed.  Treatment for this condition depends on the cause of the anemia. This information is not intended to replace advice given to you by your health care provider. Make sure you discuss any questions you have with your health care provider. Document Released: 01/20/2005 Document Revised: 01/14/2017 Document Reviewed: 01/14/2017 Elsevier Interactive Patient Education  [employer].   We will be in touch later today with your results.

## 2018-02-03 ENCOUNTER — Encounter (HOSPITAL_COMMUNITY): Payer: Self-pay

## 2018-02-03 ENCOUNTER — Ambulatory Visit (HOSPITAL_COMMUNITY)
Admission: RE | Admit: 2018-02-03 | Discharge: 2018-02-03 | Disposition: A | Discharge status: Home / Self Care | Payer: BC Managed Care – PPO | Source: Ambulatory Visit | Attending: Family Medicine | Admitting: Family Medicine

## 2018-02-03 ENCOUNTER — Encounter: Payer: Self-pay | Admitting: Family Medicine

## 2018-02-03 DIAGNOSIS — D649 Anemia, unspecified: Secondary | ICD-10-CM | POA: Insufficient documentation

## 2018-02-03 LAB — ABO/RH: ABO/RH(D): A POS

## 2018-02-03 LAB — PREPARE RBC (CROSSMATCH)

## 2018-02-03 MED ORDER — SODIUM CHLORIDE 0.9 % IV SOLN: Freq: Once | INTRAVENOUS | Status: DC

## 2018-02-03 NOTE — Progress Notes (Signed)
Pt arrived for transfusion of 2 units PRBCs; pt consent obtained; units transfused per order; pt alert, oriented, and ambulatory upon discharge; no complications noted  Ordering Provider: Rita Ohara, MD  Associated Diagnosis: Symptomatic anemia

## 2018-02-03 NOTE — Discharge Instructions (Signed)

## 2018-02-05 LAB — BPAM RBC
BLOOD PRODUCT EXPIRATION DATE: 201902142359
BLOOD PRODUCT EXPIRATION DATE: 201902162359
ISSUE DATE / TIME: 201902080940
ISSUE DATE / TIME: 201902080940
UNIT TYPE AND RH: 6200
Unit Type and Rh: 6200

## 2018-02-05 LAB — TYPE AND SCREEN
ABO/RH(D): A POS
ANTIBODY SCREEN: NEGATIVE
UNIT DIVISION: 0
Unit division: 0

## 2018-02-10 ENCOUNTER — Other Ambulatory Visit: Payer: Self-pay | Admitting: Physician Assistant

## 2018-02-10 DIAGNOSIS — R1310 Dysphagia, unspecified: Secondary | ICD-10-CM

## 2018-02-16 ENCOUNTER — Ambulatory Visit
Admission: RE | Admit: 2018-02-16 | Discharge: 2018-02-16 | Disposition: A | Discharge status: Home / Self Care | Payer: BC Managed Care – PPO | Source: Ambulatory Visit | Attending: Physician Assistant | Admitting: Physician Assistant

## 2018-02-16 DIAGNOSIS — R1310 Dysphagia, unspecified: Secondary | ICD-10-CM

## 2018-02-20 HISTORY — PX: ESOPHAGOGASTRODUODENOSCOPY: SHX1529

## 2018-02-20 LAB — HM COLONOSCOPY

## 2018-03-22 LAB — HM COLONOSCOPY

## 2018-03-29 ENCOUNTER — Encounter: Payer: Self-pay | Admitting: *Deleted

## 2018-03-29 NOTE — Progress Notes (Signed)
Chief Complaint  Patient presents with  . Annual Exam    fasting (blood in lab) annual exam with pelvic. No new concerns.     April Garcia is a 62 y.o. female who presents for a complete physical.  She has the following concerns:  Iron deficiency anemia:  She is s/p 2 U transfusion, EGD and colonoscopy.  This showed 6cm hiatal hernia, ulcers, gastric erosions, Schatzki's ring which was dilated, benign gastric nodules and chronic peptic duodenitis. She had 37mm adenomatous polyp (and 2 hyperplastic ones).  2 year f/u colonoscopy was recommended. Last Hg was 10.3 at [facility] on 3/18.  Pantoprazole caused diarrhea, was changed to Dexilant.  Stools are much improved since changing PPI.  Stools are dark due to iron. Denies constipation, abdominal pain. She denies further GI bleeding or symptoms of anemia.  Hypertension follow-up.  BP's are running 120-125/79-82 at home. She checks with wrist monitor that she we have verified in the past.  Denies dizziness, headaches, chest pain. Denies side effects of medications. Ankles swell some, chronic. Denies any significant change to the discoloration on her lower legs.  Had recent left foot pain, near her arch, and had increased swelling.  She wore compression socks for a bit, and it is doing better now.  Swelling is back to baseline now, pain resolved. Has a set of orthotics in the past from podiatrist, not using them (doesn't know where exactly they are).  Obesity: eating proteins, beans, berries, following a somewhat ketogenic diet.  Cut back on carbs, high protein, at least 1 salad daily.  Doesn't feel hungry.  Eating more beef recently for the iron. She reported that on 08/02/17 she was 282# at her house, has continued to lose weight.  She has been limited in ability to exercise to due weakness/anemia.  She plans to start back to water aerobics next week.  She feels well enough that she can restart stationery bike at home as well.She still walks her dog  in the morning 15-20 minutes (stop/go, not aerobic).  Vitamin D deficiency. She last had level checked in January 2018, and it was normal at 41.  She continues to take a daily supplement.   Hypothyroidism: Treated and monitored by Dr. Chalmers Cater.She goes in the summer (July), dose hasn't been changed.  No changes in hair/skin/moods/energy (just intentional weight loss). "I feel normal".   Immunization History  Administered Date(s) Administered  . Influenza Split 09/26/2012  . Influenza,inj,Quad PF,6+ Mos 12/24/2014, 12/24/2015, 10/10/2017  . Influenza-Unspecified 10/25/2016  . Td 02/24/2005  . Tdap 09/20/2012  . Zoster 12/24/2014   Last Pap smear: 2007 (she is s/p hysterectomy) Last mammogram: 07/2017 Last colonoscopy: 01/2018, f/u due in 2 years (01/2020) Last DEXA: 2006 Dentist: every 3-6 months for cleanings Ophtho: several years ago Exercise: Walks dog; plans to resume water aerobics and exercise bike. Vitamin D-OH level was 28 in 11/2015, 41 in 12/2016 Lipids:  Lab Results  Component Value Date   CHOL 143 12/25/2015   HDL 62 12/25/2015   LDLCALC 72 12/25/2015   TRIG 47 12/25/2015   CHOLHDL 2.3 12/25/2015   Past Medical History:  Diagnosis Date  . Elevated blood sugar   . Hypertension 2014  . Hypothyroidism    followed by Dr. Chalmers Cater  . Multinodular goiter   . Seizure ([place]) childhood   off medication since age 28  . Tuberous sclerosis ([place])    diagnosed by Dr. Ubaldo Glassing    Past Surgical History:  Procedure Laterality Date  .  ABDOMINAL HYSTERECTOMY  1995   for fibroids (Dr. Ree Edman)  . BREAST EXCISIONAL BIOPSY Right   . BREAST SURGERY     cyst removal from right breast.  . FOOT SURGERY     toenails removed and tubers removed  . LASIK     monovision (R for distance, L for reading)  . MOUTH SURGERY  08/2013   cyst removed, upper jaw  . TONSILLECTOMY  age 67  . VARICOSE VEIN SURGERY     Dr. Aleda Grana    Social History   Socioeconomic History  . Marital  status: Single    Spouse name: Not on file  . Number of children: Not on file  . Years of education: Not on file  . Highest education level: Not on file  Occupational History  . 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 Financial resource strain: Not on file  . Food insecurity:    Worry: Not on file    Inability: Not on file  . Transportation needs:    Medical: Not on file    Non-medical: Not on file  Tobacco Use  . Smoking status: Never Smoker  . Smokeless tobacco: Never Used  Substance and Sexual Activity  . Alcohol use: Yes    Comment: maybe 2-5 times per year.  . Drug use: No  . Sexual activity: Not Currently  Lifestyle  . Physical activity:    Days per week: Not on file    Minutes per session: Not on file  . Stress: Not on file  Relationships  . Social connections:    Talks on phone: Not on file    Gets together: Not on file    Attends religious service: Not on file    Active member of club or organization: Not on file    Attends meetings of clubs or organizations: Not on file    Relationship status: Not on file  . Intimate partner violence:    Fear of current or ex partner: Not on file    Emotionally abused: Not on file    Physically abused: Not on file    Forced sexual activity: Not on file  Other Topics Concern  . Not on file  Social History Narrative   Single, has 1 dog (toy poodle)--Rosie.   She is retired (prior Licensed conveyancer and [employer])   Visits her mother daily (has Engineer, manufacturing during the day for her)    Family History  Problem Relation Age of Onset  . Breast cancer Mother 47       stage 0  . Hypothyroidism Mother        now normal, off meds  . Dementia Mother   . Dementia Father   . Heart disease Father        arrhythmia (cardioverted)  . Colon polyps Father        "precancerous"  . Tuberous sclerosis Father   . Breast cancer Maternal Aunt   . Breast cancer  Maternal Aunt   . Stroke Cousin   . Stroke Cousin   . Colon cancer Cousin 89  . Heart disease Maternal Grandfather        MI in 91's  . Heart disease Paternal Grandfather        MI in 68's  . Diabetes Neg Hx     Outpatient Encounter Medications as of 03/30/2018  Medication Sig Note  . amLODipine (NORVASC) 5 MG tablet TAKE ONE TABLET  BY MOUTH DAILY   . DEXILANT 60 MG capsule    . ferrous sulfate 325 (65 FE) MG tablet Take 325 mg by mouth 3 (three) times daily with meals.   Marland Kitchen levothyroxine (SYNTHROID, LEVOTHROID) 125 MCG tablet Take 125 mcg by mouth daily.   . vitamin C (ASCORBIC ACID) 500 MG tablet Take 500 mg by mouth 3 (three) times daily.   . Vitamin D, Cholecalciferol, 1000 UNITS TABS Take 1 Dose by mouth daily. Reported on 12/24/2015   . Multiple Vitamins-Minerals (MULTIVITAMIN PO) Take 1 tablet by mouth daily. 12/29/2016: Takes Flinstone vitamin daily   No facility-administered encounter medications on file as of 03/30/2018.     Allergies  Allergen Reactions  . Adhesive [Tape]   . Lisinopril-Hydrochlorothiazide Rash    ROS: The patient denies anorexia, fever, headaches, vision changes, decreased hearing, ear pain, sore throat, breast concerns, chest pain, palpitations, dizziness, syncope, dyspnea on exertion, cough, swelling, nausea, vomiting, diarrhea, constipation, abdominal pain, melena, hematochezia, indigestion/heartburn, hematuria, incontinence, dysuria, vaginal bleeding, discharge, odor or itch, genital lesions, joint pains, numbness, tingling, weakness, tremor, suspicious skin lesions, depression, anxiety, abnormal bleeding/bruising, or enlarged lymph nodes. Occasional itching on the left leg (where stasis changes are present, at shin). Recent left foot pain and swelling, resolved.  Some mild chronic swelling in both feet/ankles. Normal bowels (dark from iron), no abdominal pain. Energy improved.   PHYSICAL EXAM:  BP (!) 170/96   Pulse 84   Ht 5' 7.25" (1.708 m)    Wt 262 lb 3.2 oz (118.9 kg)   BMI 40.76 kg/m   160/88 on repeat by MD  Wt Readings from Last 3 Encounters:  03/30/18 262 lb 3.2 oz (118.9 kg)  02/01/18 270 lb 6.4 oz (122.7 kg)  10/10/17 270 lb 9.6 oz (122.7 kg)     General Appearance:   Alert, cooperative, no distress, appears stated age.  Head:   Normocephalic, without obvious abnormality, atraumatic  Eyes:   PERRL, conjunctiva/corneas clear, EOM's intact, fundi normal on the right, not well visualized on the left  Ears:   Normal TM's and external ear canals  Nose:  Nares normal, mucosa is mildly edematous with clear mucus. No sinustenderness  Throat:  Lips, mucosa, and tongue normal; teeth and gums normal  Neck:  Supple, no lymphadenopathy; Thyroid: Thyroid enlarged, more prominent on the right, nontender; no carotid bruit or JVD  Back:  Spine nontender, no curvature, ROM normal, no CVA tenderness  Lungs:   Clear to auscultation bilaterally without wheezes, rales or ronchi; respirations unlabored  Chest Wall:   No tenderness or deformity  Heart:   Regular rate and rhythm, S1 and S2 normal, no murmur, rub  or gallop  Breast Exam:   No tenderness, masses, or nipple discharge or inversion. Small skin tag at left nipple. WHSS at R breast. Slight redness under left breast, no maceration. No axillary lymphadenopathy  Abdomen:   Soft, non-tender, obese, nondistended, normoactive bowel sounds,no masses, no hepatosplenomegaly  Genitalia:   Normal external genitalia without lesions. BUS and vagina normal; surgically absent uterus. No adnexal masses or tenderness ,though exam is limited by body habitus. Pap not performed.  Rectal:  Not performed today (had at GI, and recent colonoscopy, no current complaints)  Extremities:  No clubbing, cyanosis; nonpitting edema at ankles bilaterally  Pulses:  2+ and symmetric all extremities  Skin:  Skin color,  texture, turgor normal. Small nonpigmented papules across back and elsewhere, nontender. Scattered skin tags and SK's also present.  Onychomycosis of  both feet toenails, along with tubers on L 5th toe and right great toe. Some stasis changes at the left shin (hyperpigmentation), minimal on the right shin  Lymph nodes:  Cervical, supraclavicular, and axillary nodes normal  Neurologic:  CNII-XII intact, normal strength, sensation and gait; reflexes 2+ and symmetric throughout  Psych: Normal mood, affect, hygiene and grooming.   ASSESSMENT/PLAN  Annual physical exam - Plan: POCT Urinalysis DIP (Proadvantage Device), Visual acuity screening, Lipid panel, Glucose, random  Essential hypertension, benign - Elevated today, normal at home per verified monitor. Cont monitoring, low Na diet, resume exercise, cont weight loss - Plan: amLODipine (NORVASC) 5 MG tablet  Vitamin D deficiency - continue daily supplement  Iron deficiency anemia due to chronic blood loss - due to ulcers, erosions duodenitis. Improving/stable. Cont PPI  Hiatal hernia  Duodenitis - H pylori negative. Cont PPI  Gastric erosion, unspecified chronicity - cont Dexilant, avoid NSAIDs  Adenomatous polyp of colon, unspecified part of colon - repeat colonoscopy due in 2 years  Hypothyroidism, unspecified type - monitored by Dr. Chalmers Cater. Euthyroid by history   F/u with ophtho recommended.  Discussed monthly self breast exams and yearly mammograms; at least 30 minutes of aerobic activity at least 5 days/week, weight-bearing exercise 2x/wk; proper sunscreen use reviewed; healthy diet, including goals of calcium and vitamin D intake and alcohol recommendations (less than or equal to 1 drink/day) reviewed; regular seatbelt use; changing batteries in smoke detectors. Immunization recommendations discussed--UTD; Continue yearly flu shots; recommended Shingrix when available (check with insurance and call  back for NV if/when avail). Colonoscopy recommendations reviewed--UTD; due again 01/2020

## 2018-03-30 ENCOUNTER — Ambulatory Visit: Payer: BC Managed Care – PPO | Admitting: Family Medicine

## 2018-03-30 ENCOUNTER — Other Ambulatory Visit: Payer: BC Managed Care – PPO

## 2018-03-30 ENCOUNTER — Encounter: Payer: Self-pay | Admitting: Family Medicine

## 2018-03-30 VITALS — BP 160/88 | HR 84 | Ht 67.25 in | Wt 262.2 lb

## 2018-03-30 DIAGNOSIS — K2980 Duodenitis without bleeding: Secondary | ICD-10-CM

## 2018-03-30 DIAGNOSIS — E559 Vitamin D deficiency, unspecified: Secondary | ICD-10-CM | POA: Diagnosis not present

## 2018-03-30 DIAGNOSIS — D126 Benign neoplasm of colon, unspecified: Secondary | ICD-10-CM

## 2018-03-30 DIAGNOSIS — Z0000 Encounter for general adult medical examination without abnormal findings: Secondary | ICD-10-CM

## 2018-03-30 DIAGNOSIS — K259 Gastric ulcer, unspecified as acute or chronic, without hemorrhage or perforation: Secondary | ICD-10-CM

## 2018-03-30 DIAGNOSIS — D500 Iron deficiency anemia secondary to blood loss (chronic): Secondary | ICD-10-CM

## 2018-03-30 DIAGNOSIS — E039 Hypothyroidism, unspecified: Secondary | ICD-10-CM

## 2018-03-30 DIAGNOSIS — I10 Essential (primary) hypertension: Secondary | ICD-10-CM | POA: Diagnosis not present

## 2018-03-30 DIAGNOSIS — K449 Diaphragmatic hernia without obstruction or gangrene: Secondary | ICD-10-CM

## 2018-03-30 LAB — POCT URINALYSIS DIP (PROADVANTAGE DEVICE)
BILIRUBIN UA: NEGATIVE
GLUCOSE UA: NEGATIVE mg/dL
LEUKOCYTES UA: NEGATIVE
NITRITE UA: NEGATIVE
Protein Ur, POC: NEGATIVE mg/dL
RBC UA: NEGATIVE
Specific Gravity, Urine: 1.03
UUROB: NEGATIVE
pH, UA: 6 (ref 5.0–8.0)

## 2018-03-30 MED ORDER — AMLODIPINE BESYLATE 5 MG PO TABS: 5.0000 mg | ORAL_TABLET | Freq: Every day | ORAL | 1 refills | Status: DC

## 2018-03-30 NOTE — Patient Instructions (Addendum)
  HEALTH MAINTENANCE RECOMMENDATIONS:  It is recommended that you get at least 30 minutes of aerobic exercise at least 5 days/week (for weight loss, you may need as much as 60-90 minutes). This can be any activity that gets your heart rate up. This can be divided in 10-15 minute intervals if needed, but try and build up your endurance at least once a week.  Weight bearing exercise is also recommended twice weekly.  Eat a healthy diet with lots of vegetables, fruits and fiber.  "Colorful" foods have a lot of vitamins (ie green vegetables, tomatoes, red peppers, etc).  Limit sweet tea, regular sodas and alcoholic beverages, all of which has a lot of calories and sugar.  Up to 1 alcoholic drink daily may be beneficial for women (unless trying to lose weight, watch sugars).  Drink a lot of water.  Calcium recommendations are 1200-1500 mg daily (1500 mg for postmenopausal women or women without ovaries), and vitamin D 1000 IU daily.  This should be obtained from diet and/or supplements (vitamins), and calcium should not be taken all at once, but in divided doses.  Monthly self breast exams and yearly mammograms for women over the age of 19 is recommended.  Sunscreen of at least SPF 30 should be used on all sun-exposed parts of the skin when outside between the hours of 10 am and 4 pm (not just when at beach or pool, but even with exercise, golf, tennis, and yard work!)  Use a sunscreen that says "broad spectrum" so it covers both UVA and UVB rays, and make sure to reapply every 1-2 hours.  Remember to change the batteries in your smoke detectors when changing your clock times in the spring and fall.  Use your seat belt every time you are in a car, and please drive safely and not be distracted with cell phones and texting while driving.  Continue your vitamin D daily.  I recommend getting the new shingles vaccine (Shingrix). You will need to check with your insurance to see if it is covered, and if  covered by Medicare Part D, you need to get from the pharmacy rather than our office.  It is a series of 2 injections, spaced 2 months apart.  If it is covered, contact our office to schedule nurse visit, and if we don't have the vaccine available, we will put your name on a list and contact you when it becomes available.  Please ask Verdene Lennert to put you on the list.  Your blood pressure was high today.  Please be sure to continue to regularly monitor it at home, and let us know if it is persistently >135-140/85-90 at home. Consider bringing your monitor and list of blood pressure at your next visit just to again verify the accuracy.  Consider using claritin or allegra if needed for allergies (was noted on your nasal exam).    Please call to schedule a routine eye exam.

## 2018-03-31 DIAGNOSIS — K259 Gastric ulcer, unspecified as acute or chronic, without hemorrhage or perforation: Secondary | ICD-10-CM | POA: Insufficient documentation

## 2018-03-31 DIAGNOSIS — K2980 Duodenitis without bleeding: Secondary | ICD-10-CM | POA: Insufficient documentation

## 2018-03-31 DIAGNOSIS — D500 Iron deficiency anemia secondary to blood loss (chronic): Secondary | ICD-10-CM | POA: Insufficient documentation

## 2018-03-31 DIAGNOSIS — D126 Benign neoplasm of colon, unspecified: Secondary | ICD-10-CM | POA: Insufficient documentation

## 2018-03-31 DIAGNOSIS — K449 Diaphragmatic hernia without obstruction or gangrene: Secondary | ICD-10-CM | POA: Insufficient documentation

## 2018-03-31 LAB — LIPID PANEL
Chol/HDL Ratio: 2.4 ratio (ref 0.0–4.4)
Cholesterol, Total: 144 mg/dL (ref 100–199)
HDL: 60 mg/dL (ref >39)
LDL Calculated: 77 mg/dL (ref 0–99)
TRIGLYCERIDES: 36 mg/dL (ref 0–149)
VLDL Cholesterol Cal: 7 mg/dL (ref 5–40)

## 2018-03-31 LAB — GLUCOSE, RANDOM: Glucose: 80 mg/dL (ref 65–99)

## 2018-04-03 ENCOUNTER — Encounter: Payer: Self-pay | Admitting: *Deleted

## 2018-04-04 ENCOUNTER — Encounter: Payer: Self-pay | Admitting: Family Medicine

## 2018-04-12 ENCOUNTER — Encounter: Payer: Self-pay | Admitting: *Deleted

## 2018-04-13 ENCOUNTER — Encounter: Payer: Self-pay | Admitting: Family Medicine

## 2018-07-31 ENCOUNTER — Other Ambulatory Visit: Payer: Self-pay | Admitting: Family Medicine

## 2018-07-31 DIAGNOSIS — Z1231 Encounter for screening mammogram for malignant neoplasm of breast: Secondary | ICD-10-CM

## 2018-08-07 ENCOUNTER — Ambulatory Visit
Admission: RE | Admit: 2018-08-07 | Discharge: 2018-08-07 | Disposition: A | Discharge status: Home / Self Care | Payer: BC Managed Care – PPO | Source: Ambulatory Visit | Attending: Family Medicine | Admitting: Family Medicine

## 2018-08-07 DIAGNOSIS — Z1231 Encounter for screening mammogram for malignant neoplasm of breast: Secondary | ICD-10-CM

## 2018-10-01 NOTE — Progress Notes (Signed)
Chief Complaint  Patient presents with  . Hypertension    nonfasting (had coffee with creamer) med check. No concerns. Is able to have shingrix shot today.     Iron deficiency anemia, due to GI bleed, noted in 01/2018. She had 2 U transfusion, EGD and colonoscopy.  This showed 6cm hiatal hernia, ulcers, gastric erosions, Schatzki's ring which was dilated, benign gastric nodules and chronic peptic duodenitis. She had 85mm adenomatous polyp (and 2 hyperplastic ones).  2 year f/u colonoscopy was recommended. She continues to f/u with GI. Note from 05/2018 reviewed.  She states that she had further labs done in August ,and that after that panel, iron was stopped.  Due for follow-up studies again with them in February (q6 months).  She was initially treated with Pantoprazole, changed to Dexilant due to diarrhea.  She still has some left, but reports being more sporadic in taking, taking it 2-3 times/week, can't tell a difference whether or not she takes it (has no symptoms, no pain, heartburn).  She was taking it daily when she took iron, but since stopping the iron in August, got out of routine. Doesn't recall being told how long she needs to take Orrum for, isn't sure if she is still supposed to be taking it daily or not.  Denies constipation, abdominal pain. She denies further GI bleeding or symptoms of anemia.  Hypertensionfollow-up.  BP's are running112-120/70's at home. She checks with wrist monitor that she we have verified in the past.  Denies dizziness, headaches, chest pain. Denies side effects of medications. Ankles are swelling less than in the past. Denies any significant change to the discoloration on her lower legs.  Obesity:  6 months ago she reported: eating proteins, beans, berries, following a somewhat ketogenic diet.  Cut back on carbs, high protein, at least 1 salad daily.  Doesn't feel hungry.   She continues to follow this diet, and continues to lose weight.  She reports  max weight was 282# in 8/18.  For a while she had limited ability to exercise to due weakness/anemia. Back to water aerobics MWF Back to stationery bike, but just occasionally, not on a routine. She still walks her dog in the morning 15-20 minutes (stop/go, not aerobic).  Mother is the same, dementia, but seems happy. She visits her daily.  Ho Vitamin D deficiency. Shelast had level checked in January 2018, and it was normal at 41. She continues to take a daily supplement. This is monitored by Dr. Chalmers Cater.  Hypothyroidism: Treated and monitored by Dr. Chalmers Cater.She goes in the summer (July), but had to reschedule to later this month.  No changes in hair/skin/moods/energy (just intentional weight loss).  PMH, PSH, SH reviewed  Outpatient Encounter Medications as of 10/02/2018  Medication Sig Note  . amLODipine (NORVASC) 5 MG tablet Take 1 tablet (5 mg total) by mouth daily.   Marland Kitchen DEXILANT 60 MG capsule  10/02/2018: Taking 2-3x/week since stopping iron in August  . levothyroxine (SYNTHROID, LEVOTHROID) 125 MCG tablet Take 125 mcg by mouth daily.   . Multiple Vitamins-Minerals (MULTIVITAMIN PO) Take 1 tablet by mouth daily. 12/29/2016: Takes Flinstone vitamin daily  . vitamin C (ASCORBIC ACID) 500 MG tablet Take 500 mg by mouth 3 (three) times daily.   . Vitamin D, Cholecalciferol, 1000 UNITS TABS Take 1 Dose by mouth daily. Reported on 12/24/2015   . [DISCONTINUED] ferrous sulfate 325 (65 FE) MG tablet Take 325 mg by mouth 3 (three) times daily with meals.    No  facility-administered encounter medications on file as of 10/02/2018.    Allergies  Allergen Reactions  . Adhesive [Tape]   . Lisinopril-Hydrochlorothiazide Rash    ROS:  No fever, chills, URI symptoms, headaches, dizziness, chest pain, GI complaints. Denies changes in hair/skin/bowels/energy. Some ankle swelling and skin discoloration in lower legs, unchanged.  Denies shortness of breath, joint pains, depression, anxiety. Occasional  sneezing spells.   PHYSICAL EXAM:  BP 130/90   Pulse 64   Ht 5' 7.25" (1.708 m)   Wt 244 lb (110.7 kg)   BMI 37.93 kg/m   132/80 on repeat by MD  Wt Readings from Last 3 Encounters:  10/02/18 244 lb (110.7 kg)  03/30/18 262 lb 3.2 oz (118.9 kg)  02/01/18 270 lb 6.4 oz (122.7 kg)    Well developed, pleasant, obese female in good spirits HEENT: conjunctiva and sclera are clear, EOMI Neck: no lymphadenopathy, thyromegaly or mass Heart: regular rate and rhythm Lungs: clear bilaterally Abdomen: soft, obese, nontender, no organomegaly or mass Extremities: minimal edema.   Skin: L shin 8 x 12 cm area in central shin, and slightly to the left is slightly dry, slightly pinkish, with the superior most portion being light brown/hyperpigmented On right leg there is minimal hyperpigmentation and a few papules present. (less dry and thickened than in the past, on the left shin) Psych: normal mood, affect, hygiene and grooming Neuro: alert and oriented, cranial nerves intact, normal gait    ASSESSMENT/PLAN:   Essential hypertension, benign - BP's at home are good (borderline here); continue current meds; continue healthy diet, exercise, weight loss. Discussed if/how/when meds would be cut back - Plan: amLODipine (NORVASC) 5 MG tablet  Iron deficiency anemia due to chronic blood loss - resolved. Will check with GI re: if she needs to stay on Dexilant indefinitely or if can stop  Hiatal hernia  Need for influenza vaccination - Plan: Flu Vaccine QUAD 6+ mos PF IM (Fluarix Quad PF)  Need for shingles vaccine - Plan: Varicella-zoster vaccine IM (Shingrix)  Essential hypertension, benign - Plan: amLODipine (NORVASC) 5 MG tablet  Class 2 severe obesity due to excess calories with serious comorbidity and body mass index (BMI) of 37.0 to 37.9 in adult [facility]) - congratulated her on her weight loss. Continue healthy diet. Add back in exercise bike 3d/wk when not doing water aerobics   Flu  shot given Shingrix #1 given Risks/SE of vaccines reviewed.  No labs needed today, GI is monitoring her CBC's.  Return for NV 2 mos for Shingrix #2  Already scheduled for 03/2019 visit

## 2018-10-02 ENCOUNTER — Ambulatory Visit: Payer: BC Managed Care – PPO | Admitting: Family Medicine

## 2018-10-02 ENCOUNTER — Encounter: Payer: Self-pay | Admitting: Family Medicine

## 2018-10-02 VITALS — BP 132/80 | HR 64 | Ht 67.25 in | Wt 244.0 lb

## 2018-10-02 DIAGNOSIS — D500 Iron deficiency anemia secondary to blood loss (chronic): Secondary | ICD-10-CM

## 2018-10-02 DIAGNOSIS — I10 Essential (primary) hypertension: Secondary | ICD-10-CM

## 2018-10-02 DIAGNOSIS — Z23 Encounter for immunization: Secondary | ICD-10-CM

## 2018-10-02 DIAGNOSIS — K449 Diaphragmatic hernia without obstruction or gangrene: Secondary | ICD-10-CM

## 2018-10-02 DIAGNOSIS — Z6837 Body mass index (BMI) 37.0-37.9, adult: Secondary | ICD-10-CM

## 2018-10-02 MED ORDER — AMLODIPINE BESYLATE 5 MG PO TABS: 5.0000 mg | ORAL_TABLET | Freq: Every day | ORAL | 1 refills | Status: DC

## 2018-10-02 NOTE — Patient Instructions (Signed)
Please call your GI and ask whether or not you should be taking the Dexilant daily still, or there is a plan to stop it. (taking it occasionally is not likely the proper thing to be doing, but I don't know what to tell you).

## 2018-10-21 ENCOUNTER — Other Ambulatory Visit: Payer: Self-pay | Admitting: Endocrinology

## 2018-10-21 DIAGNOSIS — E049 Nontoxic goiter, unspecified: Secondary | ICD-10-CM

## 2018-10-24 ENCOUNTER — Ambulatory Visit
Admission: RE | Admit: 2018-10-24 | Discharge: 2018-10-24 | Disposition: A | Discharge status: Home / Self Care | Payer: BC Managed Care – PPO | Source: Ambulatory Visit | Attending: Endocrinology | Admitting: Endocrinology

## 2018-10-24 DIAGNOSIS — E049 Nontoxic goiter, unspecified: Secondary | ICD-10-CM

## 2018-12-04 ENCOUNTER — Other Ambulatory Visit: Payer: BC Managed Care – PPO

## 2018-12-11 ENCOUNTER — Other Ambulatory Visit (INDEPENDENT_AMBULATORY_CARE_PROVIDER_SITE_OTHER): Payer: BC Managed Care – PPO

## 2018-12-11 ENCOUNTER — Telehealth: Payer: Self-pay | Admitting: Family Medicine

## 2018-12-11 DIAGNOSIS — Z23 Encounter for immunization: Secondary | ICD-10-CM | POA: Diagnosis not present

## 2018-12-11 NOTE — Telephone Encounter (Signed)
Pt came in for injection and when checking out she stated that Dr. Donna Christen at [place] wants her to stay on [place].

## 2018-12-11 NOTE — Telephone Encounter (Signed)
noted 

## 2019-04-01 NOTE — Progress Notes (Signed)
Start time: 10:58 End time: 11:25  Virtual Visit via Video Note  I connected with April Garcia on 04/02/2019 by a video enabled telemedicine application and verified that I am speaking with the correct person using two identifiers. She is home alone, on her porch with her dog. MD is in office.   I discussed the limitations of evaluation and management by telemedicine and the availability of in person appointments. The patient expressed understanding and agreed to proceed.  History of Present Illness:  Chief Complaint  Patient presents with  . Hypertension    med check for hypertension. No concerns.    Hypertensionfollow-up.BP's at home arerunning 110-121/69-low 70's at home.  It was 121/72 this morning.  She checks withwrist monitor that shewe have verified in the past.  Denies dizziness, headaches, chest pain. Denies side effects of medications.  No longer has any swelling in her ankles. Denies any significant change to the discoloration on her lower legs.  H/o Iron deficiency anemia, due to GI bleed, noted in 01/2018.She had 2 U transfusion, EGD and colonoscopy. This showed 6cm hiatal hernia, ulcers, gastric erosions, Schatzki's ring which was dilated, benign gastric nodules and chronic peptic duodenitis. She had 17m adenomatous polyp (and 2 hyperplastic ones). 2 year f/u colonoscopywas recommended. She continues to f/u with GI. Last note received from them was 05/2018.  She states that she had further labs done in August, and that after that panel, iron was stopped. She was told she didn't need to be rechecked for a year. She was told to stay on PPI due to hiatal hernia.  She remains on Dexilant (Pantoprazole caused diarrhea). Denies constipation, abdominal pain.She denies further GI bleeding or symptoms of anemia.  She was able to donate blood on 3/15, reports that her Hgb was 15.1.   Obesity:Last year she changed to a mostly ketogenic diet, eating proteins, beans,  berries. Cut back on carbs, high protein, at least 1 salad daily. Doesn't feel hungry. She admits that she started eating carbs around Christmas, weight got back up to 250.  She cut them back again, and has lost some of the weight.  She reports max weight was 282# in 8/18.   Exercise routine has changed since COVID-19 pandemic.  She previously had been going to water aerobics MWF, occasional exercise bike, plus walking her dog 15-20 minutes every morning (stop/go, not aerobic). She recently had been biking more regularly, even while still able to go to water aerobics, doing 5 miles/30 minutes. [facility] she has been biking daily, and increased to 10 miles in 30-45 minutes, 6-7 days/week Has bands/5# weight at home, hasn't been using. (previously got weight bearing exercise using weights in water aerobics classes).  Hypothyroidism: Treated and monitored by Dr. BChalmers CaterNo changes in hair/skin/moods/energy (just intentional weight loss). She had [place]aperformed 09/2018: IMPRESSION: Moderately heterogeneous appearing thyroid gland without definitive new or worrisome thyroid nodule. Last TSH was in 09/2018, atShe remains on 125 mcg dose.  Mother is the same, now 972years old, with dementia, but stable and doing well.  She visits her daily.  PMH, PSH, SH reviewed  Outpatient Encounter Medications as of 04/02/2019  Medication Sig Note  . amLODipine (NORVASC) 5 MG tablet Take 1 tablet (5 mg total) by mouth daily.   .Marland KitchenDEXILANT 60 MG capsule  04/02/2019: Takes it most days (occasionally misses a day)  . levothyroxine (SYNTHROID, LEVOTHROID) 125 MCG tablet Take 125 mcg by mouth daily.   . Multiple  Vitamins-Minerals (MULTIVITAMIN PO) Take 1 tablet by mouth daily. 04/02/2019: Adult with iron  . Vitamin D, Cholecalciferol, 1000 UNITS TABS Take 1 Dose by mouth daily. Reported on 12/24/2015   . [DISCONTINUED] vitamin C (ASCORBIC ACID) 500 MG tablet Take 500 mg by mouth 3 (three)  times daily.    No facility-administered encounter medications on file as of 04/02/2019.    ROS: no fever, chills, headaches, dizziness, chest pain, shortness of breath, GI complaints, urinary complaints. No joint pains, skin concerns or other problems. See HPI   Observations/Objective:  BP 128/72   Pulse 65   Ht 5' 7.25" (1.708 m)   Wt 246 lb (111.6 kg)   BMI 38.24 kg/m   Wt Readings from Last 3 Encounters:  10/02/18 244 lb (110.7 kg)  03/30/18 262 lb 3.2 oz (118.9 kg)  02/01/18 270 lb 6.4 oz (122.7 kg)   Exam limited due to virtual nature of the visit.  Weight and BP were taken at home today. On video, she appears well, in good spirits. She is alert, oriented, cranial nerves appear grossly intact. Normal mood, affect, grooming   Assessment and Plan:  Essential hypertension, benign - well controlled on current regimen. Cont exercise, weight loss.   - Plan: Comprehensive metabolic panel, amLODipine (NORVASC) 5 MG tablet  Hypothyroidism, unspecified type - euthyroid, under care of Dr. Balan  Hiatal hernia - continues on daily PPI per GI.  iron deficiency has resolved (from GI bleed)  Class 2 severe obesity due to excess calories with serious comorbidity and body mass index (BMI) of 38.0 to 38.9 in adult (HCC) - counseled re: diet, exercise, risks of obesity, weight loss.   Due for c-met.  Blood counts and thyroid are monitored by other providers.  Due to her age and daily exposure to her 91 year old mother, I do not recommend she come in to the office at this time for labs. Can schedule for June-July.  She will return for CPE as rescheduled for 10/202 Should not need any labs prior (unless her c-met is abnormal)  Follow Up Instructions:    I discussed the assessment and treatment plan with the patient. The patient was provided an opportunity to ask questions and all were answered. The patient agreed with the plan and demonstrated an understanding of the instructions.    The patient was advised to call back or seek an in-person evaluation if the symptoms worsen or if the condition fails to improve as anticipated.  I provided 27 minutes of non-face-to-face time during this encounter.   Eve A Knapp, MD   

## 2019-04-02 ENCOUNTER — Encounter: Payer: Self-pay | Admitting: Family Medicine

## 2019-04-02 ENCOUNTER — Other Ambulatory Visit: Payer: Self-pay

## 2019-04-02 ENCOUNTER — Ambulatory Visit (INDEPENDENT_AMBULATORY_CARE_PROVIDER_SITE_OTHER): Payer: BC Managed Care – PPO | Admitting: Family Medicine

## 2019-04-02 VITALS — BP 128/72 | HR 65 | Ht 67.25 in | Wt 246.0 lb

## 2019-04-02 DIAGNOSIS — I10 Essential (primary) hypertension: Secondary | ICD-10-CM

## 2019-04-02 DIAGNOSIS — K449 Diaphragmatic hernia without obstruction or gangrene: Secondary | ICD-10-CM | POA: Diagnosis not present

## 2019-04-02 DIAGNOSIS — E039 Hypothyroidism, unspecified: Secondary | ICD-10-CM

## 2019-04-02 DIAGNOSIS — Z6838 Body mass index (BMI) 38.0-38.9, adult: Secondary | ICD-10-CM

## 2019-04-02 MED ORDER — AMLODIPINE BESYLATE 5 MG PO TABS: 5.0000 mg | ORAL_TABLET | Freq: Every day | ORAL | 1 refills | Status: DC

## 2019-04-02 NOTE — Patient Instructions (Signed)
Continue your current medications. Your blood pressure is well controlled. Continue to monitor. Continue to follow low sodium diet. Continue your daily exercise and weight loss attempt. Continue low carb diet.  We will be contacting you to schedule a fasting lab visit--no rush, probably in June-July (when safer).

## 2019-06-11 ENCOUNTER — Other Ambulatory Visit: Payer: Self-pay

## 2019-06-11 ENCOUNTER — Other Ambulatory Visit: Payer: BC Managed Care – PPO

## 2019-06-11 DIAGNOSIS — I10 Essential (primary) hypertension: Secondary | ICD-10-CM

## 2019-06-11 LAB — COMPREHENSIVE METABOLIC PANEL
ALT: 11 IU/L (ref 0–32)
AST: 13 IU/L (ref 0–40)
Albumin/Globulin Ratio: 1.8 (ref 1.2–2.2)
Albumin: 4.2 g/dL (ref 3.8–4.8)
Alkaline Phosphatase: 87 IU/L (ref 39–117)
BUN/Creatinine Ratio: 14 (ref 12–28)
BUN: 10 mg/dL (ref 8–27)
Bilirubin Total: 0.3 mg/dL (ref 0.0–1.2)
CO2: 24 mmol/L (ref 20–29)
Calcium: 9.4 mg/dL (ref 8.7–10.3)
Chloride: 105 mmol/L (ref 96–106)
Creatinine, Ser: 0.72 mg/dL (ref 0.57–1.00)
GFR calc Af Amer: 102 mL/min/{1.73_m2} (ref >59)
GFR calc non Af Amer: 89 mL/min/{1.73_m2} (ref >59)
Globulin, Total: 2.4 g/dL (ref 1.5–4.5)
Glucose: 97 mg/dL (ref 65–99)
Potassium: 4.3 mmol/L (ref 3.5–5.2)
Sodium: 142 mmol/L (ref 134–144)
Total Protein: 6.6 g/dL (ref 6.0–8.5)

## 2019-09-27 ENCOUNTER — Other Ambulatory Visit: Payer: Self-pay | Admitting: Family Medicine

## 2019-09-27 DIAGNOSIS — I10 Essential (primary) hypertension: Secondary | ICD-10-CM

## 2019-10-08 ENCOUNTER — Telehealth: Payer: Self-pay | Admitting: *Deleted

## 2019-10-08 NOTE — Telephone Encounter (Signed)
Please advise pt--she does NOT need any labs done before her physical.  Cancel her Wednesday lab visit.  We did chem panel in June.  She has other doctors monitoring her blood counts and thyroid, and cholesterol was fine last year.

## 2019-10-08 NOTE — Telephone Encounter (Signed)
Patient coming in for CPE next Monday afternoon and wants to come in for labs this Wed am. Need orders please.

## 2019-10-09 NOTE — Telephone Encounter (Signed)
Informed pt and canceled lab visit

## 2019-10-10 ENCOUNTER — Other Ambulatory Visit: Payer: Self-pay

## 2019-10-14 NOTE — Progress Notes (Signed)
Chief Complaint  Patient presents with  . Annual Exam    CPE with pelvic exam no other issues, still has some swelling at the ankle sbut has improved because of weight loss, still has tubers on rt. great toe, had flu shot about 1 month ago at [facility] 09/16/19   April Garcia is a 63 y.o. female who presents for a complete physical.    Hypertensionfollow-up.BP's at home arerunning 112-120's/70's.  She checks withwrist monitor that shewe have verified in the past.  Denies dizziness, headaches, chest pain. Denies side effects of medications. Ankle swelling has decreased, shoes are looser. Denies any significant change to the discoloration on her lower legs, only sometimes a little dry.  H/o GI bleed 01/2018. At that time, EGD showed 6cm hiatal hernia, ulcers, gastric erosions, Schatzki's ring which was dilated, benign gastric nodules and chronic peptic duodenitis. On colonoscopy she had 40mm adenomatous polyp (and 2 hyperplastic ones). 2 year f/u colonoscopywas recommended.  Iron deficiency and anemia had been monitored by GI until resolved.  Last visit was virtual, in 04/2019.   Virtual, so no labs done. She is back to donating blood regularly, last about 2 months ago, and Hg was 15.9.  She was told to stay on PPI due to hiatal hernia.  She remains on Dexilant (Pantoprazole caused diarrhea). Denies constipation, abdominal pain.She denies further GI bleeding or symptoms of anemia. Will be changing to Medicare soon, and may need to try generic PPI's in order to get prior auth if not effective/tolerated.  Obesity: A couple of years ago she changed to a mostly ketogenic diet,eating proteins, beans, berries.  She then changed to more plant based (too hard to eat all the protein), with lean proteins (ground [place], chicken, shrimp), doesn't eat sugar, white flour; only small servings of rice, pasta, doesn't eat after 7pm. She has continued to lose weight. (max weight was 282#  in8/180  Exercise routine has changed since COVID-19 pandemic.  She previously had been going to water aerobicsMWF, occasional exercise bike, plus walking her dog 15-20 minutes every morning (stop/go, not aerobic).[facility] she has been biking daily (exercise bike 50mph for at least 30-40 minutes), still walking the dog 20 minutes daily. Hasn't been using her weights/bands recently. (previously got weight bearing exercise using weights in water aerobics classes). [facility] has reopened, hasn't been a member, so hasn't been able to get a spot in the smaller classes, but will be when she is on Medicare/Silver Sneakers.  Hypothyroidism: Treated and monitored by Dr. Chalmers Cater.No changes in hair/skin/moods/energy. Has noted just a few hot flashes (worse when it warm out), just occasional short flushes.   She had US performed 09/2018: IMPRESSION: Moderately heterogeneous appearing thyroid gland without definitive new or worrisome thyroid nodule. Last TSH was in 09/2018.  She remains on 125 mcg dose. Has appointment this week with Dr. Chalmers Cater.  Ho Vitamin D deficiency. Level in January2018 was normal at 41. She continues to take a daily supplement (1000 IU) plus a MVI daily. This has monitored by Dr. Chalmers Cater in the past.  Mother is the same, now 19 years old, with dementia, but stable and doing well.  She visits her daily. She is pleasantly demented, and patient has learned to not let things bother her since she seems happy.  Immunization History  Administered Date(s) Administered  . Influenza Split 09/26/2012  . Influenza,inj,Quad PF,6+ Mos 12/24/2014, 12/24/2015, 10/10/2017, 10/02/2018  . Influenza-Unspecified 10/25/2016, 09/16/2019  . Td 02/24/2005  . Tdap  09/20/2012  . Zoster 12/24/2014  . Zoster Recombinat (Shingrix) 10/02/2018, 12/11/2018   Last Pap smear: 2007 (she is s/p hysterectomy) Last mammogram: 07/2018, scheduled for Wednesday Last colonoscopy: 01/2018, f/u  due in 2 years (01/2020) Last DEXA: 2006 Dentist: every 3-6 months for cleanings Ophtho:severalyears ago Exercise: Walks dog; exercise bike at least 30 minutes daily.  Not currently using weights. Vitamin D-OH level was 28 in 11/2015, 41 in 12/2016 Lipids:   Lab Results  Component Value Date   CHOL 144 03/30/2018   HDL 60 03/30/2018   LDLCALC 77 03/30/2018   TRIG 36 03/30/2018   CHOLHDL 2.4 03/30/2018   Past Medical History:  Diagnosis Date  . Elevated blood sugar   . Hypertension 2014  . Hypothyroidism    followed by Dr. Chalmers Cater  . Multinodular goiter   . Seizure ([place]) childhood   off medication since age 31  . Tuberous sclerosis ([place])    diagnosed by Dr. Ubaldo Glassing    Past Surgical History:  Procedure Laterality Date  . ABDOMINAL HYSTERECTOMY  1995   for fibroids (Dr. Ree Edman)  . BREAST EXCISIONAL BIOPSY Right   . BREAST SURGERY     cyst removal from right breast.  . ESOPHAGOGASTRODUODENOSCOPY  02/20/2018   Schatzki ring dilated. HH, erosive gastropathy.-Dr.Brahmbhatt  . FOOT SURGERY     toenails removed and tubers removed  . LASIK     monovision (R for distance, L for reading)  . MOUTH SURGERY  08/2013   cyst removed, upper jaw  . TONSILLECTOMY  age 37  . VARICOSE VEIN SURGERY     Dr. Aleda Grana    Social History   Socioeconomic History  . Marital status: Single    Spouse name: Not on file  . Number of children: Not on file  . Years of education: Not on file  . Highest education level: Not on file  Occupational History  . 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 Financial resource strain: Not on file  . Food insecurity    Worry: Not on file    Inability: Not on file  . Transportation needs    Medical: Not on file    Non-medical: Not on file  Tobacco Use  . Smoking status: Never Smoker  . Smokeless tobacco: Never Used  Substance and Sexual Activity  . Alcohol use: Yes    Comment: maybe 2-5  times per year.  . Drug use: No  . Sexual activity: Not Currently  Lifestyle  . Physical activity    Days per week: Not on file    Minutes per session: Not on file  . Stress: Not on file  Relationships  . Social Herbalist on phone: Not on file    Gets together: Not on file    Attends religious service: Not on file    Active member of club or organization: Not on file    Attends meetings of clubs or organizations: Not on file    Relationship status: Not on file  . Intimate partner violence    Fear of current or ex partner: Not on file    Emotionally abused: Not on file    Physically abused: Not on file    Forced sexual activity: Not on file  Other Topics Concern  . Not on file  Social History Narrative   Single, has 1 dog (toy poodle)--Rosie.   She is retired (prior Licensed conveyancer and [employer])   Visits her mother daily (has [employer] during the day for her)    Family History  Problem Relation Age of Onset  . Breast cancer Mother 53       stage 0  . Hypothyroidism Mother        now normal, off meds  . Dementia Mother   . Dementia Father   . Heart disease Father        arrhythmia (cardioverted)  . Colon polyps Father        "precancerous"  . Tuberous sclerosis Father   . Breast cancer Maternal Aunt   . Heart disease Maternal Aunt   . Breast cancer Maternal Aunt   . Stroke Cousin   . Stroke Cousin   . Colon cancer Cousin 35  . Heart disease Maternal Grandfather        MI in 12's  . Heart disease Paternal Grandfather        MI in 14's  . Diabetes Neg Hx     Outpatient Encounter Medications as of 10/15/2019  Medication Sig Note  . amLODipine (NORVASC) 5 MG tablet TAKE ONE TABLET BY MOUTH DAILY   . DEXILANT 60 MG capsule  04/02/2019: Takes it most days (occasionally misses a day)  . levothyroxine (SYNTHROID, LEVOTHROID) 125 MCG tablet Take 125 mcg by mouth daily.   . Multiple Vitamins-Minerals (MULTIVITAMIN PO) Take 1 tablet by mouth  daily. 04/02/2019: Adult with iron  . Vitamin D, Cholecalciferol, 1000 UNITS TABS Take 1 Dose by mouth daily. Reported on 12/24/2015    No facility-administered encounter medications on file as of 10/15/2019.     Allergies  Allergen Reactions  . Adhesive [Tape]   . Lisinopril-Hydrochlorothiazide Rash    ROS: The patient denies anorexia, fever, headaches, vision changes, decreased hearing, ear pain, sore throat, breast concerns, chest pain, palpitations, dizziness, syncope, dyspnea on exertion, cough, swelling, nausea, vomiting, diarrhea, constipation, abdominal pain, melena, hematochezia, indigestion/heartburn, hematuria, incontinence, dysuria, vaginal bleeding, discharge, odor or itch, genital lesions, joint pains, numbness, tingling, weakness, tremor, suspicious skin lesions, depression, anxiety, abnormal bleeding/bruising, or enlarged lymph nodes.  Continued intentional weight loss. Occasional short-lived flushing/hot flash    PHYSICAL EXAM:  BP 124/84   Pulse 78   Temp 98 F (36.7 C)   Ht 5\' 8"  (1.727 m)   Wt 232 lb 3.2 oz (105.3 kg)   BMI 35.31 kg/m   Wt Readings from Last 3 Encounters:  10/15/19 232 lb 3.2 oz (105.3 kg)  04/02/19 246 lb (111.6 kg)  10/02/18 244 lb (110.7 kg)    General Appearance:   Alert, cooperative, no distress, appears stated age.  Head:   Normocephalic, without obvious abnormality, atraumatic  Eyes:   PERRL, conjunctiva/corneas clear, EOM's intact, fundi not well visualized   Ears:   Normal TM's and external ear canals  Nose:  Not examined, wearing mask due to COVID-19 pandemic  Throat:  Not examined, wearing mask due to COVID-19 pandemic  Neck:  Supple, no mass. Thyroid enlarged, more prominent on the right,nontender; no carotid bruit or JVD. Small, nontender lymphadenopathy on the left  Back:  Spine nontender, no curvature, ROM normal, no CVA tenderness  Lungs:   Clear to auscultation bilaterally  without wheezes, rales or ronchi; respirations unlabored  Chest Wall:   No tenderness or deformity  Heart:   Regular rate and rhythm, S1 and S2 normal, no murmur, rub  or gallop  Breast Exam:   No tenderness, masses, or nipple discharge or inversion. Small  skin tag at left nipple. WHSS at R breast.  No axillary lymphadenopathy  Abdomen:   Soft, non-tender, obese, nondistended, normoactive bowel sounds,no masses, no hepatosplenomegaly  Genitalia:   Normal external genitalia without lesions. BUS and vagina normal; surgically absent uterus. No adnexal masses or tenderness ,though exam is limited by body habitus. Pap not performed.  Rectal:  Normal sphincter tone, no masses.  Heme negative stool  Extremities:  No clubbing, cyanosis or edema  Pulses:  2+ and symmetric all extremities  Skin:  Skin color, texture, turgor normal. Scattered skin tags and SK's present. Onychomycosis of both feet toenails, along with tubers on L 5th toe and left great toe. Tuber under the left 3rd fingernail.. Some stasis changes at the left shin (hyperpigmentation, with central area with some hypopigmentation, slightly pink, not warm), minimal on the right shin  Lymph nodes:  Cervical, supraclavicular, and axillary nodes normal  Neurologic:  CNII-XII intact, normal strength, sensation and gait; reflexes 2+ and symmetric throughout   Psych: Normal mood, affect, hygiene and grooming.    ASSESSMENT/PLAN:  Annual physical exam  Essential hypertension, benign - well controlled  Hypothyroidism, unspecified type - has appt with endo later this week, due for TSH  Hiatal hernia - on PPI, asymptomatic.  May need to change to generic PPI if insurance requires  Adenomatous polyp of colon, unspecified part of colon  History of GI bleed - on PPI, with no evidence of recurrence  Postmenopausal estrogen deficiency - Plan: DG Bone Density  Need  for pneumococcal vaccination - Plan: Pneumococcal conjugate vaccine 13-valent  Class 2 severe obesity due to excess calories with serious comorbidity and body mass index (BMI) of 35.0 to 35.9 in adult [facility]) - continue healthy diet, portion control, exercise and weight loss   Tubers are not bothersome, no treatment needed (at fingernail/toenails).  DEXA (age 61)--prev done 2006 at [facility];  Will schedule at [facility]  Since donating blood regularly, and Hg >15, CBC not needed today; heme negative stool.  Discussed monthly self breast exams and yearly mammograms; at least 30 minutes of aerobic activity at least 5 days/week, weight-bearing exercise at least 2x/week; proper sunscreen use reviewed; healthy diet, including goals of calcium and vitamin D intake and alcohol recommendations (less than or equal to 1 drink/day) reviewed; regular seatbelt use; changing batteries in smoke detectors.  Immunization recommendations discussed--continue yearly flu shots.  Prevnar-13 given today.  Pneumovax next year.  Colonoscopy recommendations reviewed, due again 01/2020. Recommended she schedule routine eye exam.  F/u 1 year for CPE (will be Welcome to Medicare), sooner prn.

## 2019-10-14 NOTE — Patient Instructions (Addendum)
  HEALTH MAINTENANCE RECOMMENDATIONS:  It is recommended that you get at least 30 minutes of aerobic exercise at least 5 days/week (for weight loss, you may need as much as 60-90 minutes). This can be any activity that gets your heart rate up. This can be divided in 10-15 minute intervals if needed, but try and build up your endurance at least once a week.  Weight bearing exercise is also recommended twice weekly.  Eat a healthy diet with lots of vegetables, fruits and fiber.  "Colorful" foods have a lot of vitamins (ie green vegetables, tomatoes, red peppers, etc).  Limit sweet tea, regular sodas and alcoholic beverages, all of which has a lot of calories and sugar.  Up to 1 alcoholic drink daily may be beneficial for women (unless trying to lose weight, watch sugars).  Drink a lot of water.  Calcium recommendations are 1200-1500 mg daily (1500 mg for postmenopausal women or women without ovaries), and vitamin D 1000 IU daily.  This should be obtained from diet and/or supplements (vitamins), and calcium should not be taken all at once, but in divided doses.  Monthly self breast exams and yearly mammograms for women over the age of 32 is recommended.  Sunscreen of at least SPF 30 should be used on all sun-exposed parts of the skin when outside between the hours of 10 am and 4 pm (not just when at beach or pool, but even with exercise, golf, tennis, and yard work!)  Use a sunscreen that says "broad spectrum" so it covers both UVA and UVB rays, and make sure to reapply every 1-2 hours.  Remember to change the batteries in your smoke detectors when changing your clock times in the spring and fall. Carbon monoxide detectors are recommended for your home.  Use your seat belt every time you are in a car, and please drive safely and not be distracted with cell phones and texting while driving.  Colonoscopy will be due 01/2020. I recommend getting another bone density test. [facility]--you can schedule it when you for your mammogram this week (okay to wait until January, if you prefer).  Please schedule a routine eye exam.

## 2019-10-15 ENCOUNTER — Other Ambulatory Visit: Payer: Self-pay | Admitting: Family Medicine

## 2019-10-15 ENCOUNTER — Encounter: Payer: Self-pay | Admitting: Family Medicine

## 2019-10-15 ENCOUNTER — Ambulatory Visit: Payer: BC Managed Care – PPO | Admitting: Family Medicine

## 2019-10-15 ENCOUNTER — Other Ambulatory Visit: Payer: Self-pay

## 2019-10-15 VITALS — BP 124/84 | HR 78 | Temp 98.0°F | Ht 68.0 in | Wt 232.2 lb

## 2019-10-15 DIAGNOSIS — D126 Benign neoplasm of colon, unspecified: Secondary | ICD-10-CM

## 2019-10-15 DIAGNOSIS — E039 Hypothyroidism, unspecified: Secondary | ICD-10-CM | POA: Diagnosis not present

## 2019-10-15 DIAGNOSIS — I10 Essential (primary) hypertension: Secondary | ICD-10-CM

## 2019-10-15 DIAGNOSIS — Z23 Encounter for immunization: Secondary | ICD-10-CM | POA: Diagnosis not present

## 2019-10-15 DIAGNOSIS — K449 Diaphragmatic hernia without obstruction or gangrene: Secondary | ICD-10-CM | POA: Diagnosis not present

## 2019-10-15 DIAGNOSIS — Z6835 Body mass index (BMI) 35.0-35.9, adult: Secondary | ICD-10-CM

## 2019-10-15 DIAGNOSIS — Z780 Asymptomatic menopausal state: Secondary | ICD-10-CM

## 2019-10-15 DIAGNOSIS — Z8719 Personal history of other diseases of the digestive system: Secondary | ICD-10-CM

## 2019-10-15 DIAGNOSIS — Z1231 Encounter for screening mammogram for malignant neoplasm of breast: Secondary | ICD-10-CM

## 2019-10-15 DIAGNOSIS — Z0000 Encounter for general adult medical examination without abnormal findings: Secondary | ICD-10-CM | POA: Diagnosis not present

## 2019-10-15 DIAGNOSIS — E66812 Obesity, class 2: Secondary | ICD-10-CM

## 2019-10-17 ENCOUNTER — Other Ambulatory Visit: Payer: Self-pay

## 2019-10-17 ENCOUNTER — Ambulatory Visit
Admission: RE | Admit: 2019-10-17 | Discharge: 2019-10-17 | Disposition: A | Discharge status: Home / Self Care | Payer: BC Managed Care – PPO | Source: Ambulatory Visit | Attending: Family Medicine | Admitting: Family Medicine

## 2019-10-17 DIAGNOSIS — Z1231 Encounter for screening mammogram for malignant neoplasm of breast: Secondary | ICD-10-CM

## 2019-10-30 ENCOUNTER — Other Ambulatory Visit: Payer: Self-pay | Admitting: Endocrinology

## 2019-10-30 DIAGNOSIS — E049 Nontoxic goiter, unspecified: Secondary | ICD-10-CM

## 2019-12-19 ENCOUNTER — Other Ambulatory Visit: Payer: Self-pay

## 2019-12-19 ENCOUNTER — Ambulatory Visit
Admission: RE | Admit: 2019-12-19 | Discharge: 2019-12-19 | Disposition: A | Discharge status: Home / Self Care | Payer: Medicare Other | Source: Ambulatory Visit | Attending: Family Medicine | Admitting: Family Medicine

## 2019-12-19 DIAGNOSIS — Z780 Asymptomatic menopausal state: Secondary | ICD-10-CM

## 2019-12-22 ENCOUNTER — Other Ambulatory Visit: Payer: Self-pay | Admitting: Family Medicine

## 2019-12-22 DIAGNOSIS — I10 Essential (primary) hypertension: Secondary | ICD-10-CM

## 2020-01-05 ENCOUNTER — Other Ambulatory Visit: Payer: Self-pay

## 2020-01-05 ENCOUNTER — Emergency Department (HOSPITAL_COMMUNITY)
Admission: EM | Admit: 2020-01-05 | Discharge: 2020-01-05 | Disposition: A | Discharge status: Home / Self Care | Payer: Medicare PPO | Attending: Emergency Medicine | Admitting: Emergency Medicine

## 2020-01-05 ENCOUNTER — Encounter (HOSPITAL_COMMUNITY): Payer: Self-pay | Admitting: Emergency Medicine

## 2020-01-05 DIAGNOSIS — E039 Hypothyroidism, unspecified: Secondary | ICD-10-CM | POA: Diagnosis not present

## 2020-01-05 DIAGNOSIS — I10 Essential (primary) hypertension: Secondary | ICD-10-CM

## 2020-01-05 DIAGNOSIS — Z79899 Other long term (current) drug therapy: Secondary | ICD-10-CM | POA: Insufficient documentation

## 2020-01-05 NOTE — Discharge Instructions (Addendum)
Please follow-up with your primary care doctor within the week. You may check your blood pressure twice daily and record these values and a blood pressure log to show to Dr. Tomi Bamberger. Please return to ED if you do have shortness of breath, chest pain or difficulty breathing.  Or any new or concerning symptoms.

## 2020-01-05 NOTE — ED Provider Notes (Signed)
[facility] DEPT Provider Note   CSN: [id number] Arrival date & time: 01/05/20  [id number]     History Chief Complaint  Patient presents with  . Hypertension    April Garcia is a 64 y.o. female.  HPI  Patient is a 64 year old female with history of essential hypertension on amlodipine 5 mg daily presents today with chief complaint of elevated blood pressure.  Patient states that she first noted her blood pressure was elevated proximately 1 week ago when she donated blood and was told her blood pressure was high.  She is rechecked it multiple times since and noticed that it sometimes "spikes" especially at the end of the day.  Patient states that she takes her amlodipine in the morning daily.  States that she noticed her blood pressure was elevated last night and woke up at 2 AM to recheck it.  She states that it was significantly elevated which prompted her to present to the ED this morning.  Patient states she took her amlodipine before presented to the ED and states her blood pressure was already trending downwards when she was seen in triage.  Patient denies any chest pain, shortness of breath, headache, dizziness, fevers.  Patient does state that she has felt somewhat off since the holidays.  States that she stopped exercising and feels like she gained some weight.  Patient states she has no history of heart failure not had any palpitations.  States that her legs are chronically large and states that they are "fat legs "but denies any swelling in her legs that is new.  Denies any calf tenderness.  Does state that she occasionally has cramps in her right arm but they are momentary and generally go away quickly without intervention.  Patient states that she sees Dr. Tomi Bamberger and states she will have no difficulty following up with her primary care doctor within a week for blood pressure recheck and possible titration.  Patient does also endorse several episodes  of urination this morning which she attributes to her elevated blood pressure.  Denies any burning, urgency, hematuria, discomfort, pelvic discomfort abdominal pain nausea or vomiting.    Past Medical History:  Diagnosis Date  . Elevated blood sugar   . Hypertension 2014  . Hypothyroidism    followed by Dr. Chalmers Cater  . Multinodular goiter   . Seizure ([place]) childhood   off medication since age 77  . Tuberous sclerosis ([place])    diagnosed by Dr. Ubaldo Glassing    Patient Active Problem List   Diagnosis Date Noted  . Iron deficiency anemia due to chronic blood loss 03/31/2018  . Hiatal hernia 03/31/2018  . Duodenitis 03/31/2018  . Gastric erosion 03/31/2018  . Adenomatous polyp of colon 03/31/2018  . Venous stasis dermatitis of left lower extremity 10/10/2017  . Morbid obesity with BMI of 40.0-44.9, adult ([place]) 10/10/2017  . Vitamin D deficiency 12/24/2015  . Essential hypertension, benign 02/22/2013  . Elevated BP 01/11/2013  . Hypothyroidism 09/21/2012  . Severe obesity (BMI 35.0-35.9 with comorbidity) ([place]) 09/21/2012    Past Surgical History:  Procedure Laterality Date  . ABDOMINAL HYSTERECTOMY  1995   for fibroids (Dr. Ree Edman)  . BREAST EXCISIONAL BIOPSY Right   . BREAST SURGERY     cyst removal from right breast.  . ESOPHAGOGASTRODUODENOSCOPY  02/20/2018   Schatzki ring dilated. HH, erosive gastropathy.-Dr.Brahmbhatt  . FOOT SURGERY     toenails removed and tubers removed  . LASIK     monovision (R for  distance, L for reading)  . MOUTH SURGERY  08/2013   cyst removed, upper jaw  . TONSILLECTOMY  age 89  . VARICOSE VEIN SURGERY     Dr. Aleda Grana     OB History    Gravida  0   Para  0   Term  0   Preterm  0   AB  0   Living  0     SAB  0   TAB  0   Ectopic  0   Multiple  0   Live Births              Family History  Problem Relation Age of Onset  . Breast cancer Mother 12       stage 0  . Hypothyroidism Mother        now normal, off meds  .  Dementia Mother   . Dementia Father   . Heart disease Father        arrhythmia (cardioverted)  . Colon polyps Father        "precancerous"  . Tuberous sclerosis Father   . Breast cancer Maternal Aunt   . Heart disease Maternal Aunt   . Breast cancer Maternal Aunt   . Stroke Cousin   . Stroke Cousin   . Colon cancer Cousin 76  . Heart disease Maternal Grandfather        MI in 83's  . Heart disease Paternal Grandfather        MI in 51's  . Diabetes Neg Hx     Social History   Tobacco Use  . Smoking status: Never Smoker  . Smokeless tobacco: Never Used  Substance Use Topics  . Alcohol use: Yes    Comment: maybe 2-5 times per year.  . Drug use: No    Home Medications Prior to Admission medications   Medication Sig Start Date End Date Taking? Authorizing Provider  acetaminophen (TYLENOL) 325 MG tablet Take 650 mg by mouth every 6 (six) hours as needed for mild pain or headache.   Yes [provider]  amLODipine (NORVASC) 5 MG tablet TAKE ONE TABLET BY MOUTH DAILY Patient taking differently: Take 5 mg by mouth daily.  12/22/19  Yes Rita Ohara, MD  DEXILANT 60 MG capsule Take 60 mg by mouth daily as needed ('feels a hernia').  03/29/18  Yes [provider]  levothyroxine (SYNTHROID, LEVOTHROID) 125 MCG tablet Take 125 mcg by mouth daily.   Yes [provider]  Multiple Vitamins-Minerals (MULTIVITAMIN PO) Take 1 tablet by mouth daily.   Yes [provider]  Vitamin D, Cholecalciferol, 1000 UNITS TABS Take 1,000 mg by mouth daily. Reported on 12/24/2015   Yes [provider]    Allergies    Adhesive [tape] and Lisinopril-hydrochlorothiazide  Review of Systems   Review of Systems  Constitutional: Negative for chills and fever.  HENT: Negative for congestion.   Eyes: Negative for pain.  Respiratory: Negative for cough and shortness of breath.   Cardiovascular: Negative for chest pain and leg swelling.  Gastrointestinal: Negative for  abdominal pain and vomiting.  Genitourinary: Negative for decreased urine volume, dysuria, hematuria and pelvic pain.  Musculoskeletal: Negative for myalgias.       Right arm cramps/intermittent  Skin: Negative for rash.  Neurological: Negative for dizziness and headaches.    Physical Exam Updated Vital Signs BP (!) 143/74   Pulse 79   Temp 98.4 F (36.9 C) (Oral)   Resp 17   SpO2  98%   Physical Exam Vitals and nursing note reviewed.  Constitutional:      General: She is not in acute distress.    Appearance: She is obese.     Comments: Obese 64 year old female appears stated age.  Is in no acute distress, answering questions appropriately and following commands.  Is pleasant nonanxious  HENT:     Head: Normocephalic and atraumatic.     Nose: Nose normal.     Mouth/Throat:     Mouth: Mucous membranes are moist.  Eyes:     General: No scleral icterus. Cardiovascular:     Rate and Rhythm: Normal rate and regular rhythm.     Pulses: Normal pulses.     Heart sounds: Normal heart sounds.  Pulmonary:     Effort: Pulmonary effort is normal. No respiratory distress.     Breath sounds: No wheezing.  Abdominal:     Palpations: Abdomen is soft.     Tenderness: There is no abdominal tenderness.  Musculoskeletal:     Cervical back: Normal range of motion.     Right lower leg: No edema.     Left lower leg: No edema.     Comments: Lower extremities are large but without pitting edema.  No ulcers or rash to lower extremities.  Skin:    General: Skin is warm and dry.     Capillary Refill: Capillary refill takes less than 2 seconds.  Neurological:     Mental Status: She is alert and oriented to person, place, and time. Mental status is at baseline.  Psychiatric:        Mood and Affect: Mood normal.        Behavior: Behavior normal.     ED Results / Procedures / Treatments   Labs (all labs ordered are listed, but only abnormal results are displayed) Labs Reviewed - No data to  display  EKG None  Radiology No results found.  Procedures Procedures (including critical care time)  Medications Ordered in ED Medications - No data to display  ED Course  I have reviewed the triage vital signs and the nursing notes.  Pertinent labs & imaging results that were available during my care of the patient were reviewed by me and considered in my medical decision making (see chart for details).    MDM Rules/Calculators/A&P                      Patient Briefly: patient is a 64 y.o. presenting with elevated blood pressure readings at home.  She takes amlodipine 5 mg daily and notices that her blood pressure seems elevated over the course of the day.   PE Patient does have of large lower extremities however does not appear to be fluid overloaded has no history of heart failure.  There are no crackles in her lung exam.  She is breathing well with no increased work of breathing or respiratory distress evident.  She is morbidly obese.  No calf tenderness or lower extremity edema.   Labs and imaging EKG is nonischemic.  Personally reviewed EKG which is sinus rhythm with no evidence of acute ischemia.  Isolated T wave inversion in V1.  No ST elevation or depression.   I personally reviewed all labs and imaging.  Interventions None    Reassessment On reassessment patient continues to be without any symptoms.  Pressure continues to trend downwards is 143/74.  Pulse within normal limits.    Plan We will discharge patient with  follow-up with her primary care doctor.  She states she will be seen the next week.  No indication to uptitrate her amlodipine at this time.     Additional  Patient given strict return precautions  The medical records were personally reviewed by myself. I personally reviewed all lab results and interpreted all imaging studies and either concurred with their official read or contacted radiology for clarification.   This patient appears  reasonably screened and I doubt any other medical condition requiring further workup, evaluation, or treatment in the ED at this time prior to discharge.   Patient's vitals are WNL apart from vital sign abnormalities discussed above, patient is in NAD, and able to ambulate in the ED at their baseline and able to tolerate PO.  Pain has been managed or a plan has been made for home management and has no complaints prior to discharge. Patient is comfortable with above plan and for discharge at this time. All questions were answered prior to disposition. Results from the ER workup discussed with the patient face to face and all questions answered to the best of my ability. The patient is safe for discharge with strict return precautions. Patient appears safe for discharge with appropriate follow-up. Conveyed my impression with the patient and they voiced understanding and are agreeable to plan.   An After Visit Summary was printed and given to the patient.  Portions of this note were generated with [employer]. Dictation errors may occur despite best attempts at proofreading.   I discussed this case with my attending physician who cosigned this note including patient's presenting symptoms, physical exam, and planned diagnostics and interventions. Attending physician stated agreement with plan or made changes to plan which were implemented.    Final Clinical Impression(s) / ED Diagnoses Final diagnoses:  Hypertension, unspecified type    Rx / DC Orders ED Discharge Orders    None       Tedd Sias, [place] 01/05/20 0931    Lacretia Leigh, MD 01/06/20 1423

## 2020-01-05 NOTE — ED Triage Notes (Signed)
Pt reports earlier this morning her BP went up. So she sat down and took deep breathing and took her amlodipine. Believes Bp readings are starting to come back down. Reports was also having chills and had urinary frequency.

## 2020-01-09 NOTE — Progress Notes (Signed)
Chief Complaint  Patient presents with  . Follow-up    ER follow up for high blood pressure. Went to donate blood and bp was 170/110. They took it again and it was 150/90 and she did give blood. Woke up that night and it was 160/99 and she was really hot.     Patient presents to follow up on ER visit for elevated blood pressures. She was evaluated in the ER on 1/9.  She had elevated BP when donating blood the week prior. It was 170/110, recheck came down to 150/90.  She monitored it regularly after that, and on the day prior to going to ER she had noted higher BP's towards the end of the day. BP was 160/99 that night.  She tried deep breathing, relaxing, couldn't get it down.  She took an extra 5mg  of amlodipine, and BP's came down.  It had been very high at 2am, which concerned her so she went to ER later that morning (shortly after taking the second amlodipine). BP had come down some by that point.  She is compliant with taking her amlodipine daily.  She does admit that she had stopped exercising and gained weight over the holidays and related to her and her mother's birthdays.   EKG did not show any acute changes.  No x-ray or labs were performed.  Her BP had improved in ER. Presenting BP was 159/86, which later came down to 143/74, and was 130/70 a little later, prior to discharge.  She has been monitoring it since, and if/when BP would spike, she would get some goosebumps, also had to urinate frequently and felt some burning in her legs. She would take an extra 5mg  amlodipine when the BP spike high (about 3 times over the last week).  BP was 127/71 yesterday afternoon. She fell asleep on the couch last night, was 170/96 at 9:30 last night (so took extra 5mg  last night.) 148/91 upon wakening, recheck later was 120/80 this morning.  She did not take her morning dose of amlodipine.  She reports checking BP very frequently when it was high, felt anxious.  She has been working to drink more water,  cut back on "junk".  Hasn't been feeling up to resume exercising. Her dietary indiscretion had been more with sweets/cookies, not really any change in sodium in foods. +Homemade soups (canned crushed tomatoes, chicken broth, uses frozen vegetables).  Feels refreshed in the mornings, no daytime somnolence (unless hungry, then energy improves after eating), no morning headaches.  Occasionally wakes herself up snoring, sleeps on her back. 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   PMH, PSH, SH reviewed and updated  Outpatient Encounter Medications as of 01/10/2020  Medication Sig Note  . amLODipine (NORVASC) 5 MG tablet TAKE ONE TABLET BY MOUTH DAILY (Patient taking differently: Take 5 mg by mouth daily. )   . DEXILANT 60 MG capsule Take 60 mg by mouth daily as needed ('feels a hernia').  01/10/2020: 3 times a week  . levothyroxine (SYNTHROID, LEVOTHROID) 125 MCG tablet Take 125 mcg by mouth daily.   . Multiple Vitamins-Minerals (MULTIVITAMIN PO) Take 1 tablet by mouth daily. 01/05/2020: Has iron added in  . Vitamin D, Cholecalciferol, 1000 UNITS TABS Take 1,000 mg by mouth daily. Reported on 12/24/2015   . acetaminophen (TYLENOL) 325 MG tablet Take 650 mg by mouth every 6 (six) hours as needed for mild pain or headache.    No facility-administered encounter medications on file as of 01/10/2020.  Allergies  Allergen Reactions  . Adhesive [Tape]   . Lisinopril-Hydrochlorothiazide Rash   ROS: no fever, chills, URI symptoms.  Can "tell" when her BP is high--no true headaches, no chest pain, palpitations, shortness of breath.  No GI complaints, edema, or other concerns.   PHYSICAL EXAM:  BP (!) 160/90   Pulse 80   Temp (!) 97 F (36.1 C) (Other (Comment))   Ht 5\' 8"  (1.727 m)   Wt 245 lb 6.4 oz (111.3 kg)   BMI 37.31 kg/m   157/93 on pt's wrist monitor  Wt Readings from Last 3 Encounters:  01/10/20 245 lb 6.4 oz (111.3 kg)  10/15/19 232 lb 3.2 oz (105.3 kg)  04/02/19 246  lb (111.6 kg)   Pleasant, talkative/slightly anxious female, in good spirits, in no distress HEENT: conjunctiva and sclera are clear, EOMI. Wearing mask Neck: no lymphadenopathy. Thyroid enlarged, R>L, nontender Heart: regular rate and rhythm, no murmur Lungs: clear bilaterally Abdomen: obese, soft, nontender, no abdominal bruit Extremities: no pitting edema Skin: normal turgor, no visible rash.  ASSESSMENT/PLAN:  Essential hypertension, benign - elevated recently, wide fluctuations, using extra meds prn. +wt gain. Increase amlodipine to 7.5mg , no prn doses. Low Na diet, exercise, wt loss - Plan: Home sleep test  Class 2 severe obesity due to excess calories with serious comorbidity and body mass index (BMI) of 37.0 to 37.9 in adult [facility]) - counseled re: risks of obesity, healthy diet, exercise, wt loss - Plan: Home sleep test  Snoring - concerned about poss sleep apnea given some known apnea/snoring per pt, elevated BP's and obesity. Check sleep study - Plan: Home sleep test   Low sodium diet Resume daily exercise and weight loss. To send in BP results through [facility] within a couple of weeks after increase the dose to 7.5mg  amlodipine today.    Take 1.5 tablets of amlodipine when you get home today, and continue this once daily.  Do not take extra if BP is high (only if you have bad headache or any chest pain associated with the very high blood pressure).  Work on relaxing. Do NOT check it too often, as this just increases anxiety and can increase your blood pressure even further.  Continue to try and eat a healthy diet, limit your sodium (read the labels in your food), and resume regular exercise. Losing the weight you gained should help. Send me a MyChart message in a week with your list of blood pressures. Check BP once daily, repeat 5 minutes later if high.  Do not continue to recheck. Check BP if you feel bad.  If you are having any lightheadedness or dizziness, and/or BP  under 100/50, please let me know.  We discussed the potential for sleep apnea, which can contribute to higher blood pressures (and many other risks/heart issues).  We are going to refer you for a sleep study.

## 2020-01-10 ENCOUNTER — Ambulatory Visit: Payer: Medicare PPO | Admitting: Family Medicine

## 2020-01-10 ENCOUNTER — Other Ambulatory Visit: Payer: Self-pay

## 2020-01-10 ENCOUNTER — Encounter: Payer: Self-pay | Admitting: Family Medicine

## 2020-01-10 VITALS — BP 160/90 | HR 80 | Temp 97.0°F | Ht 68.0 in | Wt 245.4 lb

## 2020-01-10 DIAGNOSIS — I10 Essential (primary) hypertension: Secondary | ICD-10-CM

## 2020-01-10 DIAGNOSIS — Z6837 Body mass index (BMI) 37.0-37.9, adult: Secondary | ICD-10-CM

## 2020-01-10 DIAGNOSIS — R0683 Snoring: Secondary | ICD-10-CM | POA: Diagnosis not present

## 2020-01-10 NOTE — Patient Instructions (Signed)
Take 1.5 tablets of amlodipine when you get home today, and continue this once daily.  Do not take extra if BP is high (only if you have bad headache or any chest pain associated with the very high blood pressure).  Work on relaxing. Do NOT check it too often, as this just increases anxiety and can increase your blood pressure even further.  Continue to try and eat a healthy diet, limit your sodium (read the labels in your food), and resume regular exercise. Losing the weight you gained should help. Send me a MyChart message in a week with your list of blood pressures. Check BP once daily, repeat 5 minutes later if high.  Do not continue to recheck. Check BP if you feel bad.  If you are having any lightheadedness or dizziness, and/or BP under 100/50, please let me know.  We discussed the potential for sleep apnea, which can contribute to higher blood pressures (and many other risks/heart issues).  We are going to refer you for a sleep study.   DASH Eating Plan DASH stands for "Dietary Approaches to Stop Hypertension." The DASH eating plan is a healthy eating plan that has been shown to reduce high blood pressure (hypertension). It may also reduce your risk for type 2 diabetes, heart disease, and stroke. The DASH eating plan may also help with weight loss. What are tips for following this plan?  General guidelines  Avoid eating more than 2,300 mg (milligrams) of salt (sodium) a day. If you have hypertension, you may need to reduce your sodium intake to 1,500 mg a day.  Limit alcohol intake to no more than 1 drink a day for nonpregnant women and 2 drinks a day for men. One drink equals 12 oz of beer, 5 oz of wine, or 1 oz of hard liquor.  Work with your health care provider to maintain a healthy body weight or to lose weight. Ask what an ideal weight is for you.  Get at least 30 minutes of exercise that causes your heart to beat faster (aerobic exercise) most days of the week. Activities  may include walking, swimming, or biking.  Work with your health care provider or diet and nutrition specialist (dietitian) to adjust your eating plan to your individual calorie needs. Reading food labels   Check food labels for the amount of sodium per serving. Choose foods with less than 5 percent of the Daily Value of sodium. Generally, foods with less than 300 mg of sodium per serving fit into this eating plan.  To find whole grains, look for the word "whole" as the first word in the ingredient list. Shopping  Buy products labeled as "low-sodium" or "no salt added."  Buy fresh foods. Avoid canned foods and premade or frozen meals. Cooking  Avoid adding salt when cooking. Use salt-free seasonings or herbs instead of table salt or sea salt. Check with your health care provider or pharmacist before using salt substitutes.  Do not fry foods. Cook foods using healthy methods such as baking, boiling, grilling, and broiling instead.  Cook with heart-healthy oils, such as olive, canola, soybean, or sunflower oil. Meal planning  Eat a balanced diet that includes: ? 5 or more servings of fruits and vegetables each day. At each meal, try to fill half of your plate with fruits and vegetables. ? Up to 6-8 servings of whole grains each day. ? Less than 6 oz of lean meat, poultry, or fish each day. A 3-oz serving of meat is  about the same size as a deck of cards. One egg equals 1 oz. ? 2 servings of low-fat dairy each day. ? A serving of nuts, seeds, or beans 5 times each week. ? Heart-healthy fats. Healthy fats called Omega-3 fatty acids are found in foods such as flaxseeds and coldwater fish, like sardines, salmon, and mackerel.  Limit how much you eat of the following: ? Canned or prepackaged foods. ? Food that is high in trans fat, such as fried foods. ? Food that is high in saturated fat, such as fatty meat. ? Sweets, desserts, sugary drinks, and other foods with added sugar. ? Full-fat  dairy products.  Do not salt foods before eating.  Try to eat at least 2 vegetarian meals each week.  Eat more home-cooked food and less restaurant, buffet, and fast food.  When eating at a restaurant, ask that your food be prepared with less salt or no salt, if possible. What foods are recommended? The items listed may not be a complete list. Talk with your dietitian about what dietary choices are best for you. Grains Whole-grain or whole-wheat bread. Whole-grain or whole-wheat pasta. Brown rice. [place]. Bulgur. Whole-grain and low-sodium cereals. Pita bread. Low-fat, low-sodium crackers. Whole-wheat flour tortillas. Vegetables Fresh or frozen vegetables (raw, steamed, roasted, or grilled). Low-sodium or reduced-sodium tomato and vegetable juice. Low-sodium or reduced-sodium tomato sauce and tomato paste. Low-sodium or reduced-sodium canned vegetables. Fruits All fresh, dried, or frozen fruit. Canned fruit in natural juice (without added sugar). Meat and other protein foods Skinless chicken or [place]. Ground chicken or [place]. Pork with fat trimmed off. Fish and seafood. Egg whites. Dried beans, peas, or lentils. Unsalted nuts, nut butters, and seeds. Unsalted canned beans. Lean cuts of beef with fat trimmed off. Low-sodium, lean deli meat. Dairy Low-fat (1%) or fat-free (skim) milk. Fat-free, low-fat, or reduced-fat cheeses. Nonfat, low-sodium ricotta or cottage cheese. Low-fat or nonfat yogurt. Low-fat, low-sodium cheese. Fats and oils Soft margarine without trans fats. Vegetable oil. Low-fat, reduced-fat, or light mayonnaise and salad dressings (reduced-sodium). Canola, safflower, olive, soybean, and sunflower oils. Avocado. Seasoning and other foods Herbs. Spices. Seasoning mixes without salt. Unsalted popcorn and pretzels. Fat-free sweets. What foods are not recommended? The items listed may not be a complete list. Talk with your dietitian about what dietary choices are best  for you. Grains Baked goods made with fat, such as croissants, muffins, or some breads. Dry pasta or rice meal packs. Vegetables Creamed or fried vegetables. Vegetables in a cheese sauce. Regular canned vegetables (not low-sodium or reduced-sodium). Regular canned tomato sauce and paste (not low-sodium or reduced-sodium). Regular tomato and vegetable juice (not low-sodium or reduced-sodium). Angie Fava. Olives. Fruits Canned fruit in a light or heavy syrup. Fried fruit. Fruit in cream or butter sauce. Meat and other protein foods Fatty cuts of meat. Ribs. Fried meat. Berniece Salines. Sausage. Bologna and other processed lunch meats. Salami. Fatback. Hotdogs. Bratwurst. Salted nuts and seeds. Canned beans with added salt. Canned or smoked fish. Whole eggs or egg yolks. Chicken or [place] with skin. Dairy Whole or 2% milk, cream, and half-and-half. Whole or full-fat cream cheese. Whole-fat or sweetened yogurt. Full-fat cheese. Nondairy creamers. Whipped toppings. Processed cheese and cheese spreads. Fats and oils Butter. Stick margarine. Lard. Shortening. Ghee. Bacon fat. Tropical oils, such as coconut, palm kernel, or palm oil. Seasoning and other foods Salted popcorn and pretzels. Onion salt, garlic salt, seasoned salt, table salt, and sea salt. Worcestershire sauce. Tartar sauce. Barbecue sauce. Teriyaki sauce. Soy  sauce, including reduced-sodium. Steak sauce. Canned and packaged gravies. Fish sauce. Oyster sauce. Cocktail sauce. Horseradish that you find on the shelf. Ketchup. Mustard. Meat flavorings and tenderizers. Bouillon cubes. Hot sauce and Tabasco sauce. Premade or packaged marinades. Premade or packaged taco seasonings. Relishes. Regular salad dressings. Where to find more information:  National Heart, Lung, and [place]: https://wilson-eaton.com/  American Heart Association: www.heart.org Summary  The DASH eating plan is a healthy eating plan that has been shown to reduce high blood pressure  (hypertension). It may also reduce your risk for type 2 diabetes, heart disease, and stroke.  With the DASH eating plan, you should limit salt (sodium) intake to 2,300 mg a day. If you have hypertension, you may need to reduce your sodium intake to 1,500 mg a day.  When on the DASH eating plan, aim to eat more fresh fruits and vegetables, whole grains, lean proteins, low-fat dairy, and heart-healthy fats.  Work with your health care provider or diet and nutrition specialist (dietitian) to adjust your eating plan to your individual calorie needs. This information is not intended to replace advice given to you by your health care provider. Make sure you discuss any questions you have with your health care provider. Document Revised: 11/25/2017 Document Reviewed: 12/06/2016 Elsevier Patient Education  2020 [employer].

## 2020-01-25 ENCOUNTER — Encounter: Payer: Self-pay | Admitting: Family Medicine

## 2020-03-20 ENCOUNTER — Ambulatory Visit (HOSPITAL_BASED_OUTPATIENT_CLINIC_OR_DEPARTMENT_OTHER): Payer: Medicare PPO | Attending: Family Medicine | Admitting: Internal Medicine

## 2020-03-20 ENCOUNTER — Other Ambulatory Visit: Payer: Self-pay

## 2020-03-20 DIAGNOSIS — R0683 Snoring: Secondary | ICD-10-CM

## 2020-03-20 DIAGNOSIS — I10 Essential (primary) hypertension: Secondary | ICD-10-CM | POA: Diagnosis not present

## 2020-03-20 DIAGNOSIS — G4733 Obstructive sleep apnea (adult) (pediatric): Secondary | ICD-10-CM | POA: Diagnosis not present

## 2020-03-20 DIAGNOSIS — Z6837 Body mass index (BMI) 37.0-37.9, adult: Secondary | ICD-10-CM | POA: Diagnosis not present

## 2020-03-20 DIAGNOSIS — R0902 Hypoxemia: Secondary | ICD-10-CM | POA: Diagnosis not present

## 2020-03-22 NOTE — Procedures (Signed)
   Patient Name: April Garcia, April Garcia Date: 03/20/2020 Gender: Female D.O.B: 03-21-1956 Age (years): 49 Referring Provider: Rita Ohara Height (inches): 23 Interpreting Physician: Baird Lyons MD, ABSM Weight (lbs): 245 RPSGT: Jonna Coup BMI: 37 MRN: [id number] Neck Size: 15.00 CLINICAL INFORMATION Sleep Study Type: HST Indication for sleep study: OSA Epworth Sleepiness Score: 5  SLEEP STUDY TECHNIQUE A multi-channel overnight portable sleep study was performed. The channels recorded were: nasal airflow, thoracic respiratory movement, and oxygen saturation with a pulse oximetry. Snoring was also monitored.  MEDICATIONS Patient self administered medications include: none reported.  SLEEP ARCHITECTURE Patient was studied for 366.7 minutes. The sleep efficiency was 98.8 % and the patient was supine for 99.9%. The arousal index was 0.0 per hour.  RESPIRATORY PARAMETERS The overall AHI was 56.3 per hour, with a central apnea index of 0.0 per hour. The oxygen nadir was 71% during sleep.  CARDIAC DATA Mean heart rate during sleep was 80.3 bpm.  IMPRESSIONS - Severe obstructive sleep apnea occurred during this study (AHI = 56.3/h). - No significant central sleep apnea occurred during this study (CAI = 0.0/h). - Oxygen desaturation was noted during this study (Min O2 = 71%).Mean 91%. - Time with O2 saturation 89% or less was 54 minutes. - Patient snored.  DIAGNOSIS - Obstructive Sleep Apnea (327.23 [G47.33 ICD-10]) - Nocturnal Hypoxemia (327.26 [G47.36 ICD-10])  RECOMMENDATIONS - Suggest CPAP titration sleep study or autopap. Other options would be based on clinical judgment. - Be careful with alcohol, sedatives and other CNS depressants that may worsen sleep apnea and disrupt normal sleep architecture. - Sleep hygiene should be reviewed to assess factors that may improve sleep quality. - Weight management and regular exercise should be initiated or  continued.  [Electronically signed] 03/22/2020 09:54 AM  Baird Lyons MD, ABSM Diplomate, American Board of Sleep Medicine   NPI: [id number]                          [place] [place] [facility]  ELECTRONICALLY SIGNED ON:  03/22/2020, 9:51 AM [place] PH: [phone]   FX: [phone] [place]

## 2020-03-23 DIAGNOSIS — G4733 Obstructive sleep apnea (adult) (pediatric): Secondary | ICD-10-CM

## 2020-03-23 DIAGNOSIS — R0683 Snoring: Secondary | ICD-10-CM | POA: Diagnosis not present

## 2020-03-26 ENCOUNTER — Other Ambulatory Visit: Payer: Self-pay | Admitting: *Deleted

## 2020-03-26 DIAGNOSIS — G4733 Obstructive sleep apnea (adult) (pediatric): Secondary | ICD-10-CM

## 2020-04-22 DIAGNOSIS — G4733 Obstructive sleep apnea (adult) (pediatric): Secondary | ICD-10-CM | POA: Diagnosis not present

## 2020-05-21 DIAGNOSIS — G4733 Obstructive sleep apnea (adult) (pediatric): Secondary | ICD-10-CM | POA: Diagnosis not present

## 2020-05-22 DIAGNOSIS — G4733 Obstructive sleep apnea (adult) (pediatric): Secondary | ICD-10-CM | POA: Diagnosis not present

## 2020-05-27 NOTE — Progress Notes (Signed)
Chief Complaint  Patient presents with  . Follow-up    1 month follow up on CPAP. She reports that she does feel a bit better.   . Other    having colonoscopy Dr. Alessandra Bevels in 2 weeks. 06/11/20.   She had sleep study performed 02/2020: IMPRESSIONS  - Severe obstructive sleep apnea occurred during this study (AHI  = 56.3/h).  - No significant central sleep apnea occurred during this study  (CAI = 0.0/h).  - Oxygen desaturation was noted during this study (Min O2 =  71%).Mean 91%.  - Time with O2 saturation 89% or less was 54 minutes.  - Patient snored.   DIAGNOSIS  - Obstructive Sleep Apnea (327.23 [G47.33 ICD-10])  - Nocturnal Hypoxemia (327.26 [G47.36 ICD-10])   RECOMMENDATIONS  - Suggest CPAP titration sleep study or autopap. Other options  would be based on clinical judgment.  - Be careful with alcohol, sedatives and other CNS depressants  that may worsen sleep apnea and disrupt normal sleep  architecture.  - Sleep hygiene should be reviewed to assess factors that may  improve sleep quality.  - Weight management and regular exercise should be initiated or  continued.   She has been using CPAP now for a month.  "I feel better" Less daytime somnolence, feels refreshed in the mornings. She has some gas some days more than others. Not in the last week.  Compliance report was reviewed--she has been wearing it every night, only 1 night for under 4 hours (fell asleep on couch).  Some leakage noted in the first week, which has resolved. She is getting used to it (as is her dog).  BP's at home are running: 113/65 prior to today's visit at home. Other readings are 115/69, 121/71, 106/59, 118/69  Obesity: 15-20# weight gain noted since her last visit, which she relates to "stress eating" Recently went back to water aerobics for the first time since 02/2019. Cutting down on dairy and bread, eating more fruit/vegetables and lean proteins. She used to do well following a keto diet,  but has always regained. She is back to riding her stationery bicycle on the days she isn't swimming. She doesn't want to weigh herself at home, as her weight can ruin mood for the day. She had some issues with significant swelling in her legs, but she reports this is much better.   PMH, PSH, SH reviewed  Outpatient Encounter Medications as of 05/28/2020  Medication Sig Note  . amLODipine (NORVASC) 5 MG tablet TAKE ONE TABLET BY MOUTH DAILY (Patient taking differently: Take 5 mg by mouth daily. )   . DEXILANT 60 MG capsule Take 60 mg by mouth daily as needed ('feels a hernia').  05/28/2020: As needed  . levothyroxine (SYNTHROID, LEVOTHROID) 125 MCG tablet Take 125 mcg by mouth daily.   . Multiple Vitamins-Minerals (MULTIVITAMIN PO) Take 1 tablet by mouth daily. 01/05/2020: Has iron added in  . Vitamin D, Cholecalciferol, 1000 UNITS TABS Take 1,000 mg by mouth daily. Reported on 12/24/2015   . acetaminophen (TYLENOL) 325 MG tablet Take 650 mg by mouth every 6 (six) hours as needed for mild pain or headache.    No facility-administered encounter medications on file as of 05/28/2020.   Allergies  Allergen Reactions  . Adhesive [Tape]   . Lisinopril-Hydrochlorothiazide Rash     ROS: no fever, chills, URI symptoms.  Denies headaches, dizziness. +edema, which has improved.  +stress, weight gain, per HPI. Fatigue and daytime somnolence improved.   PHYSICAL EXAM:  BP 138/88   Pulse 80   Ht 5\' 8"  (1.727 m)   Wt 260 lb 6.4 oz (118.1 kg)   BMI 39.59 kg/m   Pt's wrist monitor:  141/89 Wt Readings from Last 3 Encounters:  05/28/20 260 lb 6.4 oz (118.1 kg)  03/20/20 240 lb (108.9 kg)  01/10/20 245 lb 6.4 oz (111.3 kg)   Obese, pleasant female, in good spirits, talkative. HEENT: conjunctiva and sclera are clear, EOMI. Wearing mask Neck: no lymphadenopathy, thyromegaly or mass Heart: regular rate and rhythm Lungs: clear bilaterally Abdomen: soft, nontender, obese Extremities: Trace pitting  edema bilaterally Neuro: alert and oriented, normal gait Psych: normal mood, affect, hygiene, grooming, eye contact and speech   ASSESSMENT/PLAN:  Severe obstructive sleep apnea - Compliant with CPAP, tolerating, and getting benefit with improvement in symptoms. Continue CPAP. Wt loss rec  Nocturnal hypoxemia - continue use of CPAP  Class 2 severe obesity due to excess calories with serious comorbidity and body mass index (BMI) of 39.0 to 39.9 in adult [facility]) - counseled re: healthy diet, portions, exercise, risks of obesity. Consider Optavia if unable to lose on her own.  Essential hypertension, benign - white coat component, normal BP readings at home, monitor accurate. Daily exercise, wt loss and low Na diet rec  Counseled in detail about diet, weight loss, edema. Understand her concerns regarding monitoring her weight, causing more stress. Can consider MWM vs Optavia if she isn't successful on her own. Discussed low carb (but not keto), increased protein intake, MyPlate.  25-30 min FTF with additional time spent in chart review and documentation.  F/u as scheduled in October

## 2020-05-28 ENCOUNTER — Encounter: Payer: Self-pay | Admitting: Family Medicine

## 2020-05-28 ENCOUNTER — Other Ambulatory Visit: Payer: Self-pay

## 2020-05-28 ENCOUNTER — Ambulatory Visit: Payer: Medicare PPO | Admitting: Family Medicine

## 2020-05-28 VITALS — BP 138/88 | HR 80 | Ht 68.0 in | Wt 260.4 lb

## 2020-05-28 DIAGNOSIS — Z6839 Body mass index (BMI) 39.0-39.9, adult: Secondary | ICD-10-CM

## 2020-05-28 DIAGNOSIS — G4734 Idiopathic sleep related nonobstructive alveolar hypoventilation: Secondary | ICD-10-CM

## 2020-05-28 DIAGNOSIS — I10 Essential (primary) hypertension: Secondary | ICD-10-CM

## 2020-05-28 DIAGNOSIS — G4733 Obstructive sleep apnea (adult) (pediatric): Secondary | ICD-10-CM | POA: Diagnosis not present

## 2020-05-28 NOTE — Patient Instructions (Signed)
Continue your use of CPAP. Continue to monitor your blood pressure at home--it seems to be very good.  Continue to work on [employer] and exercise. I do not recommend keto diet, as the weight doesn't seem to stay off. If you are unsuccessful on your own, consider looking into Optavia, and we can give you names of coaches if you need.

## 2020-06-06 DIAGNOSIS — Z03818 Encounter for observation for suspected exposure to other biological agents ruled out: Secondary | ICD-10-CM | POA: Diagnosis not present

## 2020-06-11 DIAGNOSIS — Z86010 Personal history of colonic polyps: Secondary | ICD-10-CM | POA: Diagnosis not present

## 2020-06-11 DIAGNOSIS — K648 Other hemorrhoids: Secondary | ICD-10-CM | POA: Diagnosis not present

## 2020-06-11 DIAGNOSIS — K635 Polyp of colon: Secondary | ICD-10-CM | POA: Diagnosis not present

## 2020-06-11 DIAGNOSIS — D122 Benign neoplasm of ascending colon: Secondary | ICD-10-CM | POA: Diagnosis not present

## 2020-06-11 DIAGNOSIS — K644 Residual hemorrhoidal skin tags: Secondary | ICD-10-CM | POA: Diagnosis not present

## 2020-06-11 DIAGNOSIS — K621 Rectal polyp: Secondary | ICD-10-CM | POA: Diagnosis not present

## 2020-06-13 DIAGNOSIS — K635 Polyp of colon: Secondary | ICD-10-CM | POA: Diagnosis not present

## 2020-06-13 DIAGNOSIS — D122 Benign neoplasm of ascending colon: Secondary | ICD-10-CM | POA: Diagnosis not present

## 2020-06-13 DIAGNOSIS — K621 Rectal polyp: Secondary | ICD-10-CM | POA: Diagnosis not present

## 2020-06-16 LAB — HM COLONOSCOPY

## 2020-06-21 DIAGNOSIS — G4733 Obstructive sleep apnea (adult) (pediatric): Secondary | ICD-10-CM | POA: Diagnosis not present

## 2020-06-22 DIAGNOSIS — G4733 Obstructive sleep apnea (adult) (pediatric): Secondary | ICD-10-CM | POA: Diagnosis not present

## 2020-06-23 ENCOUNTER — Encounter: Payer: Self-pay | Admitting: *Deleted

## 2020-06-27 ENCOUNTER — Encounter: Payer: Self-pay | Admitting: Family Medicine

## 2020-07-22 DIAGNOSIS — G4733 Obstructive sleep apnea (adult) (pediatric): Secondary | ICD-10-CM | POA: Diagnosis not present

## 2020-08-07 DIAGNOSIS — H2513 Age-related nuclear cataract, bilateral: Secondary | ICD-10-CM | POA: Diagnosis not present

## 2020-08-07 DIAGNOSIS — H52223 Regular astigmatism, bilateral: Secondary | ICD-10-CM | POA: Diagnosis not present

## 2020-08-22 DIAGNOSIS — G4733 Obstructive sleep apnea (adult) (pediatric): Secondary | ICD-10-CM | POA: Diagnosis not present

## 2020-09-22 DIAGNOSIS — G4733 Obstructive sleep apnea (adult) (pediatric): Secondary | ICD-10-CM | POA: Diagnosis not present

## 2020-10-09 ENCOUNTER — Other Ambulatory Visit: Payer: Self-pay | Admitting: Endocrinology

## 2020-10-09 DIAGNOSIS — E049 Nontoxic goiter, unspecified: Secondary | ICD-10-CM

## 2020-10-17 ENCOUNTER — Ambulatory Visit
Admission: RE | Admit: 2020-10-17 | Discharge: 2020-10-17 | Disposition: A | Discharge status: Home / Self Care | Payer: Medicare PPO | Source: Ambulatory Visit | Attending: Endocrinology | Admitting: Endocrinology

## 2020-10-17 DIAGNOSIS — E039 Hypothyroidism, unspecified: Secondary | ICD-10-CM | POA: Diagnosis not present

## 2020-10-17 DIAGNOSIS — E049 Nontoxic goiter, unspecified: Secondary | ICD-10-CM

## 2020-10-17 DIAGNOSIS — E042 Nontoxic multinodular goiter: Secondary | ICD-10-CM | POA: Diagnosis not present

## 2020-10-20 DIAGNOSIS — G4733 Obstructive sleep apnea (adult) (pediatric): Secondary | ICD-10-CM | POA: Diagnosis not present

## 2020-10-21 NOTE — Progress Notes (Signed)
Chief Complaint  Patient presents with  . Medicare Wellness    nonfasting Welcome to Medicare. Would like to come back Friday for fasting labs, COVID booster and HD Flu as she is having mammo tomorrow. Had TSH and thyroid u/s with Dr.Balan last Friday and is f/u with her tomorrow. No new concerns.     April Garcia is a 64 y.o. female who presents for Welcome to Medicare visit and follow-up on chronic medical conditions.    OSA: She reports compliance with CPAP.  She continues to note less daytime somnolence, feels refreshed in the mornings. She has some gas some days more than others (burping).    Hypertensionfollow-up.She had ER visit in 12/2019 due to elevated BP's.  Since then BP's have been much better.   BPat homewas 127/78 prior to coming today.  Mid-afternoon when she gets sleepy she can sometimes see as low as 99/60, often 118-121/70's. She denies any dizziness, headaches, chest pain. She checks withwrist monitor that shewe have verified in the past.  Denies side effects of medications. Hasn't noted any edema.   H/o GI bleed 01/2018. At that time, EGD showed 6cm hiatal hernia, ulcers, gastric erosions, Schatzki's ring which was dilated, benign gastric nodules and chronic peptic duodenitis. On colonoscopy she had 38m adenomatous polyp (and 2 hyperplastic ones). 2 year f/u colonoscopywas recommended.  She had repeat colonoscopy in June 2021.  She had a sessile serrated adenoma.  Repeat colonoscopy recommended 5 years. Denies further bleeding or recurrent anemia. [place] is [place]  She was told to stay on PPI due to hiatal hernia. She remains on Dexilant (Pantoprazole caused diarrhea). It costs $64 for 30 days. Admits she hasn't been taking it every day because she doesn't feel like she needs it.  Denies heartburn, indigestion.  Some belching related to her CPAP. Denies hematochezia, melena, diarrhea, constipation,  abdominal pain.  Hypothyroidism: Treated and monitored by Dr. BChalmers CaterShe had labs and ultrasound done recently, with appointment scheduled tomorrow to review these results. No changes in hair/skin/moods/energy.  UKoreareviewed in epic--stable, recommended 1 year follow-up. Her TSH result isn't available (pt will find out at visit tomorrow).  HoVitamin D deficiency. Level in January2018 was normal at 41 when taking a daily supplement (1000 IU) plus a MVI daily. This has monitored by Dr. BChalmers Caterin the past. She stopped the separate D, only occasionally, and switched MVI to one that has iron in it (due to donating blood)  Mother passed away in [id number] age 66 She fell going to the bathroom, she found her on the ground.  She was alert, didn't seem to be injured, but turns out she fractured her shoulder.  She remained at home with caregiver for a few days, wasn't eating, got weak, ended up in hospital, then hospice. 110yo blind dog remains at her mother's house, there daily to clean out house prior to selling. She feels somewhat relieved, has been grieving the loss of her mother for years, due to her dementia.  Feels like she is handling everything very well.  Obesity: 15-20# weight gain noted at her last visit in June, which she relates to "stress eating".  She had just resumed water aerobics (since 02/2019.) and had resumed her exercise bike when not swimming.  She has continued this exercise routine regularly since June. She continues to try and limit her dairy and bread, eating more fruit/vegetables and lean proteins.  She is trying not to eat after  8pm, cut back on snacking. She used to do well following a keto diet, but has always regained. She previously reported she doesn't want to weigh herself at home, as her weight can ruin mood for the day. Now that her mother has passed, she can focus more on herself.   Immunization History  Administered Date(s) Administered  . Influenza Split  09/26/2012  . Influenza,inj,Quad PF,6+ Mos 12/24/2014, 12/24/2015, 10/10/2017, 10/02/2018  . Influenza-Unspecified 10/25/2016, 09/16/2019  . PFIZER SARS-COV-2 Vaccination 02/20/2020, 03/19/2020  . Pneumococcal Conjugate-13 10/15/2019  . Td 02/24/2005  . Tdap 09/20/2012  . Zoster 12/24/2014  . Zoster Recombinat (Shingrix) 10/02/2018, 12/11/2018   Last Pap smear: 2007 (she iss/p hysterectomy) Last mammogram:09/2019, scheduled for tomorrow Last colonoscopy:05/2020 (5 year f/u rec) Last DEXA: 11/2019 T-1.3 L fem neck Dentist: every 3-6 months for cleanings, scheduled Ophtho:recently, has cataracts they are watching, f/u scheduled 01/2021. Exercise:  Water aerobics 3x/week, exercise bike at least 30 minutes daily on days not at water aerobics.  Walks dog (very slow, no cardio).  Vitamin D-OH level was 28 in 11/2015, 41 in 12/2016 Lipids: Lab Results  Component Value Date   CHOL 144 03/30/2018   HDL 60 03/30/2018   LDLCALC 77 03/30/2018   TRIG 36 03/30/2018   CHOLHDL 2.4 03/30/2018    Other doctors caring for patient include: Endo: Dr. Chalmers Cater GI: Dr.Brahmbhatt Dentist: Dr. Dorothyann Peng at [place] Ophtho: Dr. Idolina Primer Derm: Dr. Ubaldo Glassing (not recent) Veins: Dr. Renaldo Reel  Depression screen:  negative Fall Screen: none Functional Status Survey: unremarkable (though cataracts found, being monitored) Mini-Cog: normal See Epic for full questionnaires/screens  End of Life Discussion:  Patient does not have a living will and medical power of attorney   PMH, [place], [place] and FH were reviewed and updated  Outpatient Encounter Medications as of 10/22/2020  Medication Sig Note  . amLODipine (NORVASC) 5 MG tablet TAKE ONE TABLET BY MOUTH DAILY (Patient taking differently: Take 5 mg by mouth daily. )   . levothyroxine (SYNTHROID, LEVOTHROID) 125 MCG tablet Take 125 mcg by mouth daily.   . Multiple Vitamins-Minerals (MULTIVITAMIN PO) Take 1 tablet by mouth daily. 01/05/2020: Has iron added in  .  acetaminophen (TYLENOL) 325 MG tablet Take 650 mg by mouth every 6 (six) hours as needed for mild pain or headache. (Patient not taking: Reported on 10/22/2020)   . DEXILANT 60 MG capsule Take 60 mg by mouth daily as needed ('feels a hernia').  (Patient not taking: Reported on 10/22/2020) 05/28/2020: As needed  . Vitamin D, Cholecalciferol, 1000 UNITS TABS Take 1,000 mg by mouth daily. Reported on 12/24/2015 (Patient not taking: Reported on 10/22/2020)    No facility-administered encounter medications on file as of 10/22/2020.   Allergies  Allergen Reactions  . Adhesive [Tape]   . Lisinopril-Hydrochlorothiazide Rash    ROS: The patient denies anorexia, fever, headaches, vision changes (cataracts noted by ophtho, slight decline noted), decreased hearing, ear pain, sore throat, breast concerns, chest pain, palpitations, dizziness, syncope, dyspnea on exertion, cough, swelling, nausea, vomiting, diarrhea, constipation, abdominal pain, melena, hematochezia, indigestion/heartburn, hematuria, incontinence, dysuria, vaginal bleeding, discharge, odor or itch, genital lesions, joint pains, numbness, tingling, weakness, tremor, suspicious skin lesions, depression, anxiety, abnormal bleeding/bruising, or enlarged lymph nodes.    PHYSICAL EXAM:  BP 136/82   Pulse 72   Ht _0  (1.702 m)   Wt 262 lb (118.8 kg)   BMI 41.04 kg/m   Wt Readings from Last 3 Encounters:  10/22/20 262 lb (118.8 kg)  05/28/20 260  lb 6.4 oz (118.1 kg)  03/20/20 240 lb (108.9 kg)   General Appearance:   Alert, cooperative, no distress, appears stated age.  Head:   Normocephalic, without obvious abnormality, atraumatic  Eyes:   PERRL, conjunctiva/corneas clear, EOM's intact, fundi not well visualized   Ears:   Normal TM's and external ear canals  Nose:  Not examined, wearing mask due to COVID-19 pandemic  Throat:  Not examined, wearing mask due to COVID-19 pandemic  Neck:  Supple, no mass.  Thyroid enlarged, more prominent on the right,nontender; no carotid bruit or JVD. Small, nontender lymphadenopathy on the left  Back:  Spine nontender, no curvature, ROM normal, no CVA tenderness  Lungs:   Clear to auscultation bilaterally without wheezes, rales or ronchi; respirations unlabored  Chest Wall:   No tenderness or deformity  Heart:   Regular rate and rhythm, S1 and S2 normal, no murmur, rub  or gallop  Breast Exam:   No tenderness, masses, or nipple discharge or inversion. Small skin tag at leftnipple. WHSS at R breast.No axillary lymphadenopathy  Abdomen:   Soft, non-tender, obese, nondistended, normoactive bowel sounds,no masses, no hepatosplenomegaly  Genitalia:   Normal external genitalia without lesions. BUS and vagina normal; surgically absent uterus. No adnexal masses or tenderness ,though exam is limited by body habitus. Pap not performed.  Rectal:  Normal sphincter tone, no masses.  Heme negative stool  Extremities:  No clubbing, cyanosis or edema  Pulses:  2+ and symmetric all extremities  Skin:  Skin color, texture, turgor normal. Scattered skin tags and SK's present. Onychomycosis of both feet toenails, some nails missing, along with tubers on L 5th toe and left great toe. Tuber under the left 3rd fingernail.. Some stasis changes at the left shin (hyperpigmentation, with central area with some hypopigmentation, slightly pink, not warm), unchanged, minimal on the right shin  Lymph nodes:  Cervical, supraclavicular, and axillary nodes normal  Neurologic:  CNII-XII intact, normal strength, sensation and gait; reflexes 2+ and symmetric throughout   Psych: Normal mood, affect, hygiene and grooming    ASSESSMENT/PLAN:  Welcome to Medicare preventive visit  Essential hypertension, benign - well controlled. Cont low Na diet, daily exercise.  Wt loss recommended - Plan: Lipid  panel, Comprehensive metabolic panel  Nocturnal hypoxemia - cont CPAP use  Severe obstructive sleep apnea - cont regular CPAP use  Class 3 severe obesity due to excess calories with serious comorbidity and body mass index (BMI) of 40.0 to 44.9 in adult ([place]) - comorbidities--OSA, GERD, HTN. Counseled re: healthy diet, portions, accountability/tracking, weekly vs monthly wts. Risks reviewed  Hypothyroidism, unspecified type - managed by Dr. Chalmers Cater, TSH drawn recently, euthyroid by hx. F/u with endo tomorrow as planned  Adenomatous polyp of colon, unspecified part of colon - recently had colonoscopy, f/u 5 years  History of GI bleed - pt supposed to be on PPI per GI, taking only prn.  discussed potential risks, lower cost PPI's (ie omeprazole).  To d/w her GI - Plan: CBC with Differential/Platelet  Vitamin D deficiency - recheck level since no longer taking D3 supplement - Plan: VITAMIN D 25 Hydroxy (Vit-D Deficiency, Fractures)  Medication monitoring encounter - Plan: VITAMIN D 25 Hydroxy (Vit-D Deficiency, Fractures), CBC with Differential/Platelet, Comprehensive metabolic panel  Vaccine counseling - due for HD flu shot, COVID booster, pneumovax. Getting mammo tomorrow, will return Fri for flu and COVID, 2 weeks later for pneumovax   High dose flu, COVID and labs Friday Pneumovax NV 2-3 weeks later  CBC, c-met, lipids, D (since supplements changed) TSH just done by Dr. Chalmers Cater Pt can let us know after her visit tomorrow if more than TSH was done, so that we can cancel if any duplicates before she gets blood drawn on Friday.  Discussed monthly self breast exams and yearly mammograms; at least 30 minutes of aerobic activity at least 5 days/week and weight-bearing exercise 2x/week; proper sunscreen use reviewed; healthy diet, including goals of calcium and vitamin D intake and alcohol recommendations (less than or equal to 1 drink/day) reviewed; regular seatbelt use; changing batteries in smoke  detectors, use of carbon monoxide detectors.  Immunization recommendations discussed, including current need for high dose flu shot, COVID booster, Pneumovax.  Colonoscopy recommendations reviewed, due again 05/2025  MOST form completed, full code, full care. Living Will and Healthcare power of attorney paperwork given, asked to get [place] notarized copy to be scanned at her convenience.  EKG not done, as she had one in ER in 12/2019.   Medicare Attestation I have personally reviewed: The patient's medical and social history Their use of alcohol, tobacco or illicit drugs Their current medications and supplements The patient's functional ability including ADLs,fall risks, home safety risks, cognitive, and hearing and visual impairment Diet and physical activities Evidence for depression or mood disorders  The patient's weight, height, BMI have been recorded in the chart.  I have made referrals, counseling, and provided education to the patient based on review of the above and I have provided the patient with a written personalized care plan for preventive services.     Vikki Ports, MD

## 2020-10-21 NOTE — Patient Instructions (Addendum)
  HEALTH MAINTENANCE RECOMMENDATIONS:  It is recommended that you get at least 30 minutes of aerobic exercise at least 5 days/week (for weight loss, you may need as much as 60-90 minutes). This can be any activity that gets your heart rate up. This can be divided in 10-15 minute intervals if needed, but try and build up your endurance at least once a week.  Weight bearing exercise is also recommended twice weekly.  Eat a healthy diet with lots of vegetables, fruits and fiber.  "Colorful" foods have a lot of vitamins (ie green vegetables, tomatoes, red peppers, etc).  Limit sweet tea, regular sodas and alcoholic beverages, all of which has a lot of calories and sugar.  Up to 1 alcoholic drink daily may be beneficial for women (unless trying to lose weight, watch sugars).  Drink a lot of water.  Calcium recommendations are 1200-1500 mg daily (1500 mg for postmenopausal women or women without ovaries), and vitamin D 1000 IU daily.  This should be obtained from diet and/or supplements (vitamins), and calcium should not be taken all at once, but in divided doses.  Monthly self breast exams and yearly mammograms for women over the age of 85 is recommended.  Sunscreen of at least SPF 30 should be used on all sun-exposed parts of the skin when outside between the hours of 10 am and 4 pm (not just when at beach or pool, but even with exercise, golf, tennis, and yard work!)  Use a sunscreen that says "broad spectrum" so it covers both UVA and UVB rays, and make sure to reapply every 1-2 hours.  Remember to change the batteries in your smoke detectors when changing your clock times in the spring and fall. Carbon monoxide detectors are recommended for your home.  Use your seat belt every time you are in a car, and please drive safely and not be distracted with cell phones and texting while driving.   April Garcia , Thank you for taking time to come for your Welcome to Medicare Physical. I appreciate your ongoing  commitment to your health goals. Please review the following plan we discussed and let me know if I can assist you in the future.    This is a list of the screening recommended for you and due dates:  Health Maintenance  Topic Date Due  . Flu Shot  07/27/2020  . Pneumonia vaccines (2 of 2 - PPSV23) 10/14/2020  . Mammogram  10/16/2020  . Tetanus Vaccine  09/20/2022  . Colon Cancer Screening  06/16/2025  . DEXA scan (bone density measurement)  Completed  . COVID-19 Vaccine  Completed  .  Hepatitis C: One time screening is recommended by Center for Disease Control  (CDC) for  adults born from [birth year] through 1967.   Completed  . HIV Screening  Completed   Please get [place] copies of your notarized living will and healthcare power of attorney, once completed, so that we can scan them into your chart.

## 2020-10-22 ENCOUNTER — Encounter: Payer: Self-pay | Admitting: Family Medicine

## 2020-10-22 ENCOUNTER — Other Ambulatory Visit: Payer: Self-pay

## 2020-10-22 ENCOUNTER — Ambulatory Visit (INDEPENDENT_AMBULATORY_CARE_PROVIDER_SITE_OTHER): Payer: Medicare PPO | Admitting: Family Medicine

## 2020-10-22 ENCOUNTER — Other Ambulatory Visit: Payer: Self-pay | Admitting: Family Medicine

## 2020-10-22 VITALS — BP 136/82 | HR 72 | Ht 67.0 in | Wt 262.0 lb

## 2020-10-22 DIAGNOSIS — G4733 Obstructive sleep apnea (adult) (pediatric): Secondary | ICD-10-CM | POA: Diagnosis not present

## 2020-10-22 DIAGNOSIS — D126 Benign neoplasm of colon, unspecified: Secondary | ICD-10-CM

## 2020-10-22 DIAGNOSIS — Z0000 Encounter for general adult medical examination without abnormal findings: Secondary | ICD-10-CM | POA: Diagnosis not present

## 2020-10-22 DIAGNOSIS — I10 Essential (primary) hypertension: Secondary | ICD-10-CM | POA: Diagnosis not present

## 2020-10-22 DIAGNOSIS — Z8719 Personal history of other diseases of the digestive system: Secondary | ICD-10-CM

## 2020-10-22 DIAGNOSIS — E039 Hypothyroidism, unspecified: Secondary | ICD-10-CM | POA: Diagnosis not present

## 2020-10-22 DIAGNOSIS — G4734 Idiopathic sleep related nonobstructive alveolar hypoventilation: Secondary | ICD-10-CM

## 2020-10-22 DIAGNOSIS — Z7185 Encounter for immunization safety counseling: Secondary | ICD-10-CM | POA: Diagnosis not present

## 2020-10-22 DIAGNOSIS — E559 Vitamin D deficiency, unspecified: Secondary | ICD-10-CM

## 2020-10-22 DIAGNOSIS — Z5181 Encounter for therapeutic drug level monitoring: Secondary | ICD-10-CM

## 2020-10-22 DIAGNOSIS — Z1231 Encounter for screening mammogram for malignant neoplasm of breast: Secondary | ICD-10-CM

## 2020-10-22 DIAGNOSIS — Z6841 Body Mass Index (BMI) 40.0 and over, adult: Secondary | ICD-10-CM

## 2020-10-23 ENCOUNTER — Ambulatory Visit
Admission: RE | Admit: 2020-10-23 | Discharge: 2020-10-23 | Disposition: A | Discharge status: Home / Self Care | Payer: Medicare PPO | Source: Ambulatory Visit | Attending: Family Medicine | Admitting: Family Medicine

## 2020-10-23 DIAGNOSIS — Z1231 Encounter for screening mammogram for malignant neoplasm of breast: Secondary | ICD-10-CM

## 2020-10-23 DIAGNOSIS — E039 Hypothyroidism, unspecified: Secondary | ICD-10-CM | POA: Diagnosis not present

## 2020-10-23 DIAGNOSIS — I10 Essential (primary) hypertension: Secondary | ICD-10-CM | POA: Diagnosis not present

## 2020-10-23 DIAGNOSIS — E049 Nontoxic goiter, unspecified: Secondary | ICD-10-CM | POA: Diagnosis not present

## 2020-10-24 ENCOUNTER — Other Ambulatory Visit (INDEPENDENT_AMBULATORY_CARE_PROVIDER_SITE_OTHER): Payer: Medicare PPO

## 2020-10-24 ENCOUNTER — Other Ambulatory Visit: Payer: Self-pay | Admitting: Endocrinology

## 2020-10-24 ENCOUNTER — Other Ambulatory Visit: Payer: Self-pay

## 2020-10-24 DIAGNOSIS — I10 Essential (primary) hypertension: Secondary | ICD-10-CM | POA: Diagnosis not present

## 2020-10-24 DIAGNOSIS — E049 Nontoxic goiter, unspecified: Secondary | ICD-10-CM

## 2020-10-24 DIAGNOSIS — Z23 Encounter for immunization: Secondary | ICD-10-CM | POA: Diagnosis not present

## 2020-10-24 DIAGNOSIS — Z5181 Encounter for therapeutic drug level monitoring: Secondary | ICD-10-CM | POA: Diagnosis not present

## 2020-10-24 DIAGNOSIS — Z8719 Personal history of other diseases of the digestive system: Secondary | ICD-10-CM

## 2020-10-24 DIAGNOSIS — E559 Vitamin D deficiency, unspecified: Secondary | ICD-10-CM

## 2020-10-25 LAB — COMPREHENSIVE METABOLIC PANEL
ALT: 8 IU/L (ref 0–32)
AST: 6 IU/L (ref 0–40)
Albumin/Globulin Ratio: 1.7 (ref 1.2–2.2)
Albumin: 4.3 g/dL (ref 3.8–4.8)
Alkaline Phosphatase: 104 IU/L (ref 44–121)
BUN/Creatinine Ratio: 28 (ref 12–28)
BUN: 19 mg/dL (ref 8–27)
Bilirubin Total: 0.3 mg/dL (ref 0.0–1.2)
CO2: 21 mmol/L (ref 20–29)
Calcium: 9.2 mg/dL (ref 8.7–10.3)
Chloride: 105 mmol/L (ref 96–106)
Creatinine, Ser: 0.69 mg/dL (ref 0.57–1.00)
GFR calc Af Amer: 106 mL/min/{1.73_m2} (ref >59)
GFR calc non Af Amer: 92 mL/min/{1.73_m2} (ref >59)
Globulin, Total: 2.6 g/dL (ref 1.5–4.5)
Glucose: 95 mg/dL (ref 65–99)
Potassium: 4.4 mmol/L (ref 3.5–5.2)
Sodium: 138 mmol/L (ref 134–144)
Total Protein: 6.9 g/dL (ref 6.0–8.5)

## 2020-10-25 LAB — CBC WITH DIFFERENTIAL/PLATELET
Basophils Absolute: 0 10*3/uL (ref 0.0–0.2)
Basos: 0 %
EOS (ABSOLUTE): 0.4 10*3/uL (ref 0.0–0.4)
Eos: 5 %
Hematocrit: 35.9 % (ref 34.0–46.6)
Hemoglobin: 11.3 g/dL (ref 11.1–15.9)
Immature Grans (Abs): 0 10*3/uL (ref 0.0–0.1)
Immature Granulocytes: 0 %
Lymphocytes Absolute: 1.3 10*3/uL (ref 0.7–3.1)
Lymphs: 19 %
MCH: 26.2 pg — ABNORMAL LOW (ref 26.6–33.0)
MCHC: 31.5 g/dL (ref 31.5–35.7)
MCV: 83 fL (ref 79–97)
Monocytes Absolute: 0.5 10*3/uL (ref 0.1–0.9)
Monocytes: 7 %
Neutrophils Absolute: 4.7 10*3/uL (ref 1.4–7.0)
Neutrophils: 69 %
Platelets: 248 10*3/uL (ref 150–450)
RBC: 4.32 x10E6/uL (ref 3.77–5.28)
RDW: 13.8 % (ref 11.7–15.4)
WBC: 6.8 10*3/uL (ref 3.4–10.8)

## 2020-10-25 LAB — LIPID PANEL
Chol/HDL Ratio: 2.4 ratio (ref 0.0–4.4)
Cholesterol, Total: 160 mg/dL (ref 100–199)
HDL: 66 mg/dL (ref >39)
LDL Chol Calc (NIH): 84 mg/dL (ref 0–99)
Triglycerides: 48 mg/dL (ref 0–149)
VLDL Cholesterol Cal: 10 mg/dL (ref 5–40)

## 2020-10-25 LAB — VITAMIN D 25 HYDROXY (VIT D DEFICIENCY, FRACTURES): Vit D, 25-Hydroxy: 33.9 ng/mL (ref 30.0–100.0)

## 2020-11-04 IMAGING — MG DIGITAL SCREENING BILAT W/ TOMO W/ CAD
8 series · 8 of 24 positions shown · non-contrast
Comparison: Previous exam(s).

CLINICAL DATA: Screening.

EXAM:
DIGITAL SCREENING BILATERAL MAMMOGRAM WITH TOMO AND CAD

[L MLO synth-2D]
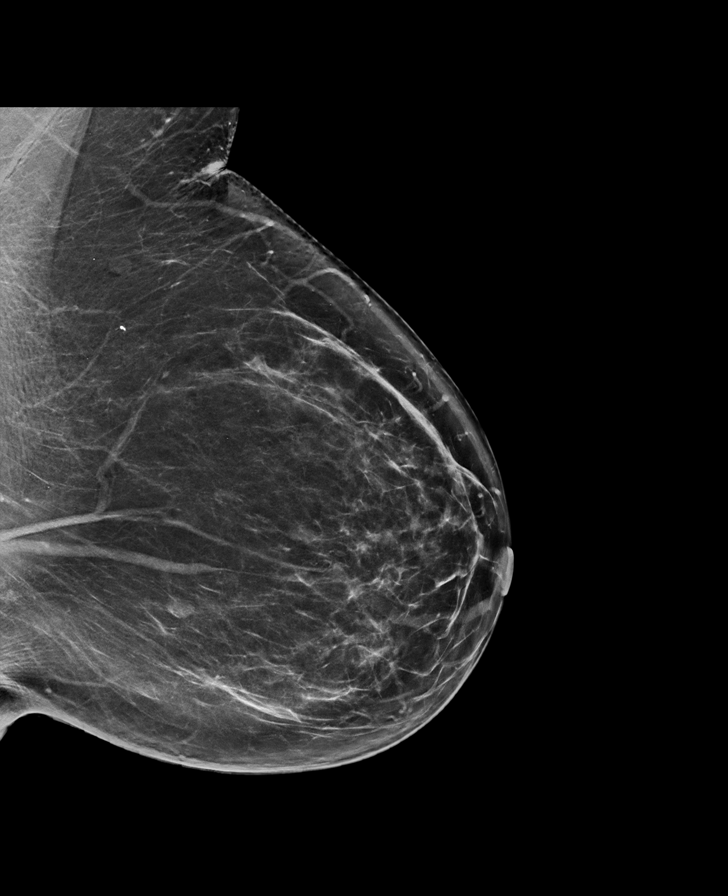

[L CC synth-2D]
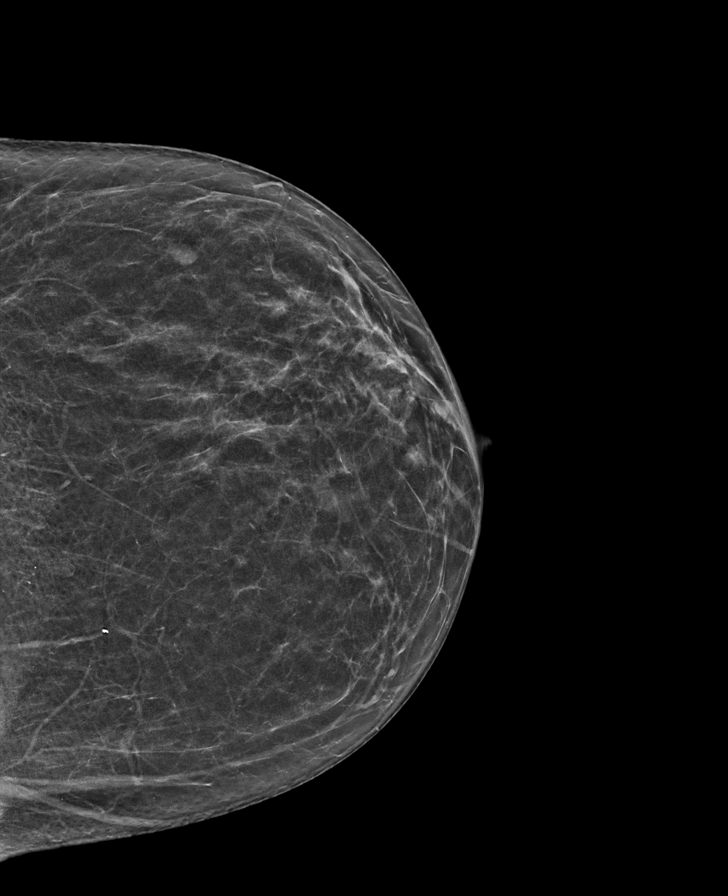

[R MLO synth-2D]
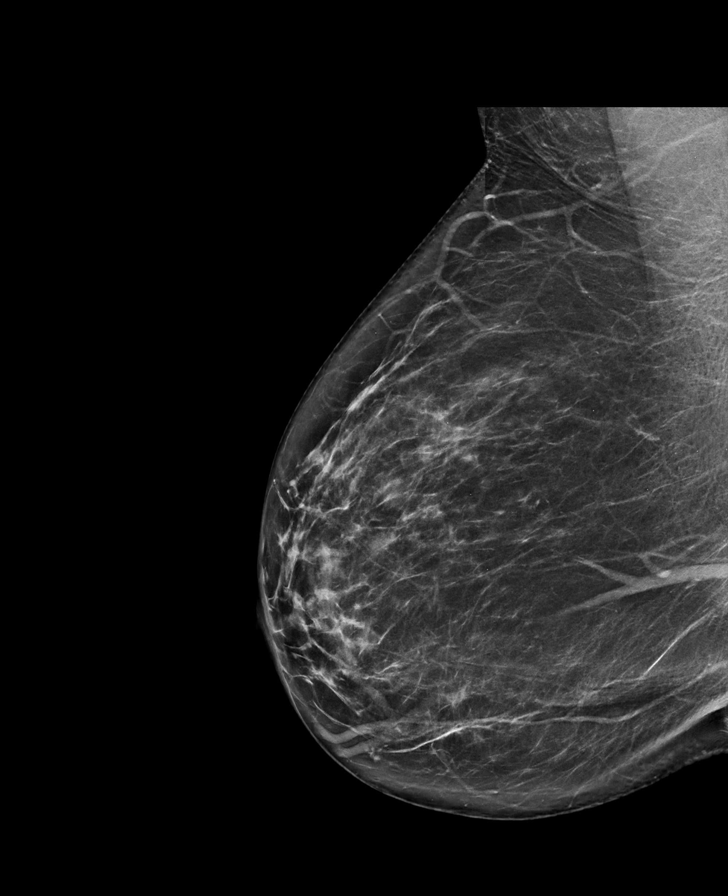

[R CC synth-2D]
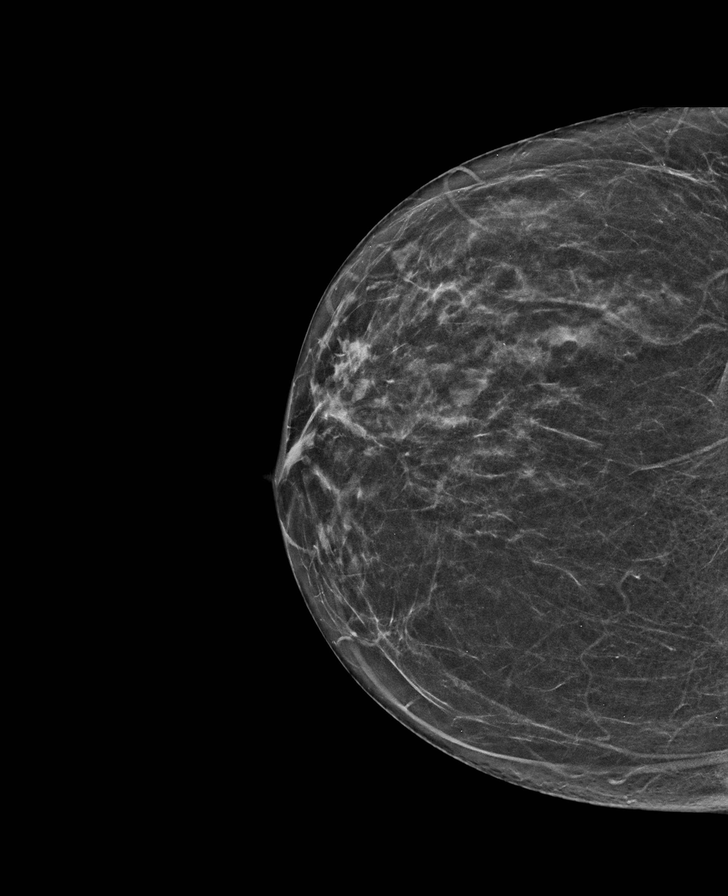

[R CC tomo · tomo slice 31/62.0]
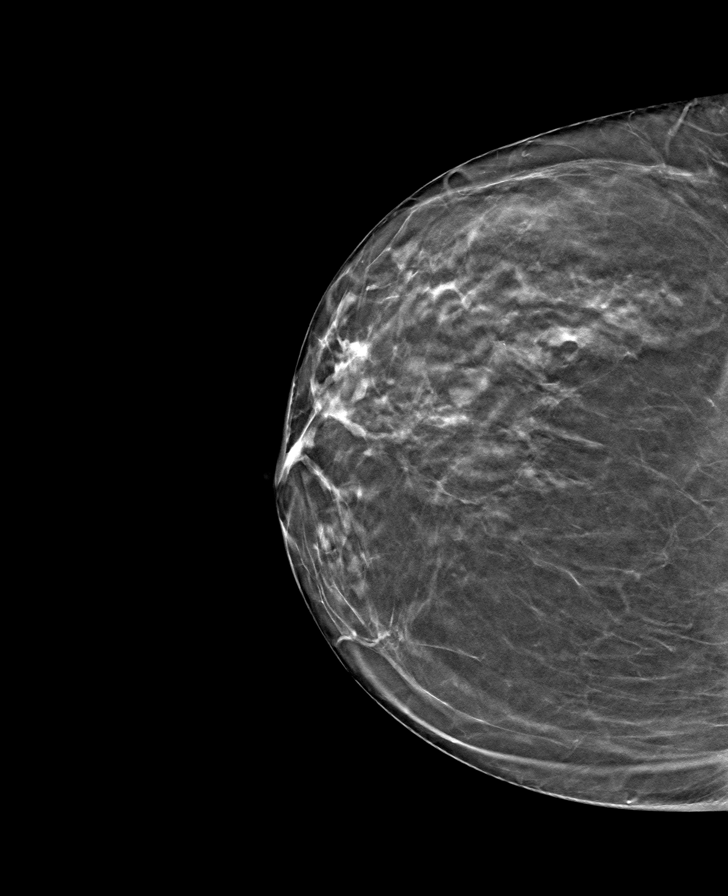

[L CC tomo · tomo slice 31/60.0]
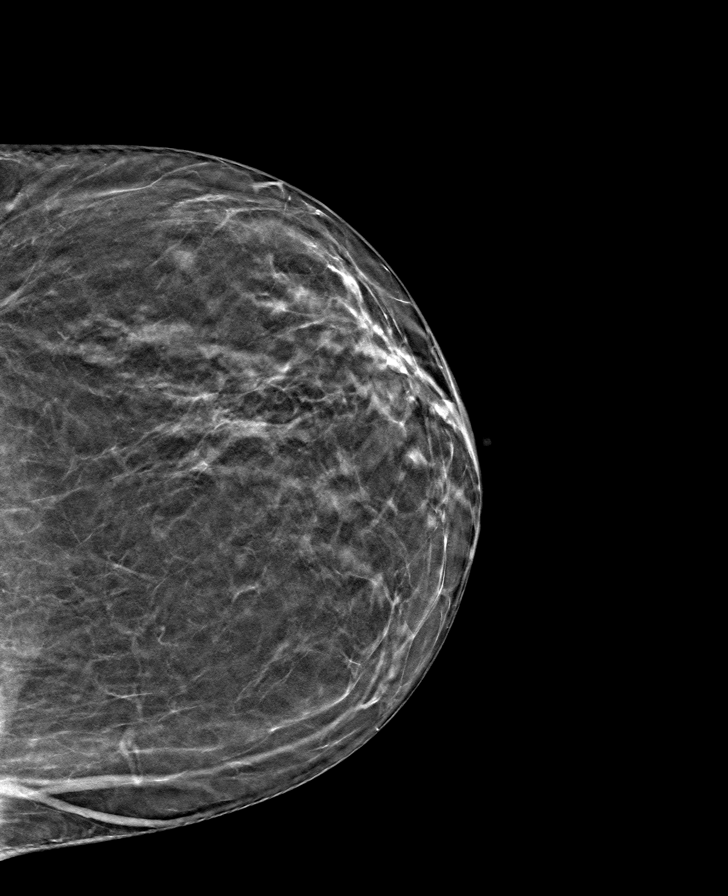

[R MLO tomo · tomo slice 39/77.0]
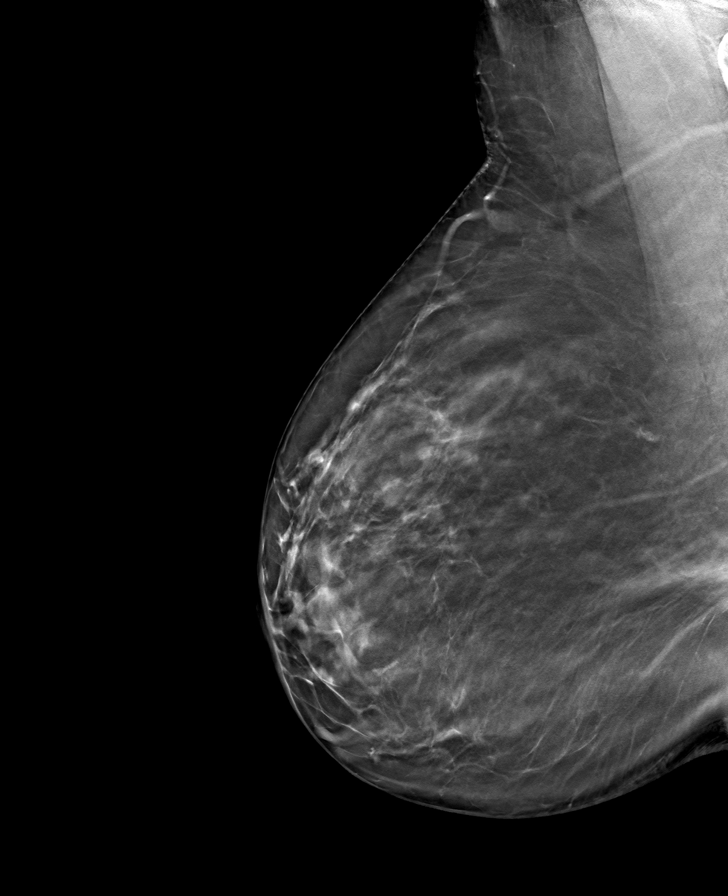

[L MLO tomo · tomo slice 41/81.0]
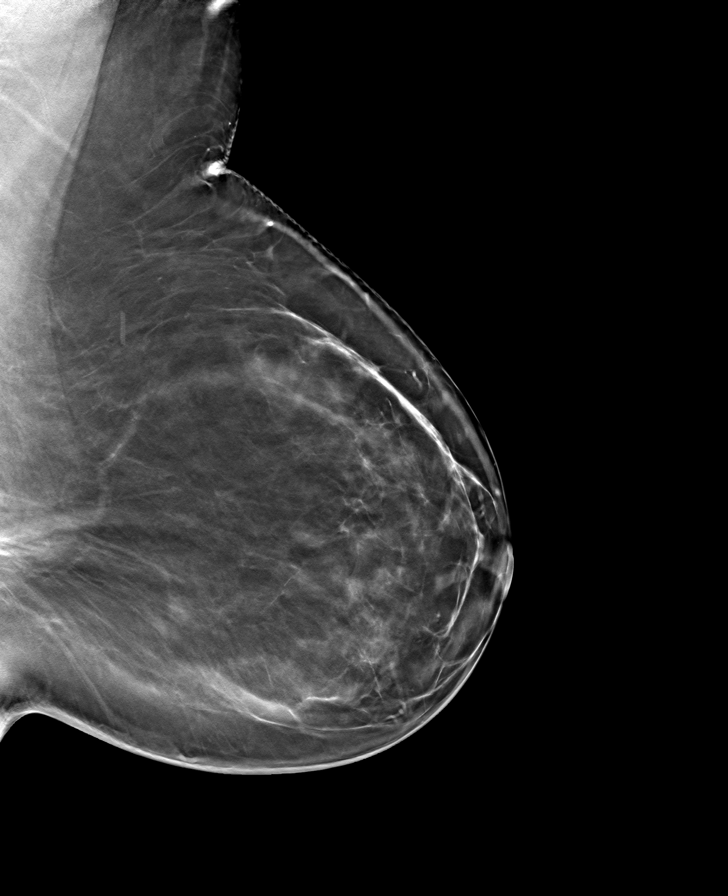

[8 of 24 positions shown; findings below may reference images not displayed]

ACR Breast Density Category b: There are scattered areas of
fibroglandular density.
FINDINGS: There are no findings suspicious for malignancy. Images were
processed with CAD.
IMPRESSION: No mammographic evidence of malignancy. A result letter of this
screening mammogram will be mailed directly to the patient.

RECOMMENDATION:
Screening mammogram in one year. (Code:[id number])

BI-RADS CATEGORY  1: Negative.

## 2020-11-07 ENCOUNTER — Other Ambulatory Visit: Payer: Self-pay

## 2020-11-07 ENCOUNTER — Other Ambulatory Visit (INDEPENDENT_AMBULATORY_CARE_PROVIDER_SITE_OTHER): Payer: Medicare PPO

## 2020-11-07 DIAGNOSIS — Z23 Encounter for immunization: Secondary | ICD-10-CM | POA: Diagnosis not present

## 2020-11-22 DIAGNOSIS — G4733 Obstructive sleep apnea (adult) (pediatric): Secondary | ICD-10-CM | POA: Diagnosis not present

## 2020-12-14 ENCOUNTER — Other Ambulatory Visit: Payer: Self-pay | Admitting: Family Medicine

## 2020-12-14 DIAGNOSIS — I10 Essential (primary) hypertension: Secondary | ICD-10-CM

## 2020-12-22 DIAGNOSIS — G4733 Obstructive sleep apnea (adult) (pediatric): Secondary | ICD-10-CM | POA: Diagnosis not present

## 2021-01-22 DIAGNOSIS — G4733 Obstructive sleep apnea (adult) (pediatric): Secondary | ICD-10-CM | POA: Diagnosis not present

## 2021-02-09 DIAGNOSIS — H2513 Age-related nuclear cataract, bilateral: Secondary | ICD-10-CM | POA: Diagnosis not present

## 2021-02-22 DIAGNOSIS — G4733 Obstructive sleep apnea (adult) (pediatric): Secondary | ICD-10-CM | POA: Diagnosis not present

## 2021-03-18 ENCOUNTER — Other Ambulatory Visit: Payer: Self-pay | Admitting: Family Medicine

## 2021-03-18 DIAGNOSIS — I10 Essential (primary) hypertension: Secondary | ICD-10-CM

## 2021-03-22 DIAGNOSIS — G4733 Obstructive sleep apnea (adult) (pediatric): Secondary | ICD-10-CM | POA: Diagnosis not present

## 2021-04-22 DIAGNOSIS — G4733 Obstructive sleep apnea (adult) (pediatric): Secondary | ICD-10-CM | POA: Diagnosis not present

## 2021-04-22 NOTE — Progress Notes (Signed)
Chief Complaint  Patient presents with  . Hypertension    Nonfasting med check, no concerns.    Patient presents for 6 month follow-up.  OSA: She reports compliance with CPAP. She uses an app on her phone to track her "scores".  She doesn't always get 7 hours of sleep, but otherwise her scores are good (pulled up on her phone).  Occasional issues with mask seal, knows how to adjust/fix (isn't a frequent issue).  Events/hour fluctuates, usually 2-3/ hour, ranging 0.5 up to max of 8.  She continues to note less daytime somnolence, feels refreshed in the mornings. No longer burping as much as in the past  She generally sleeps well through the night, sometimes up once to void.  Hypertensionfollow-up.She reports compliance with amlodipine, and denies any side effects. BP's at home are running 107-130/60's-80. Monitors once or twice a week. Monitor was verified as accurate 12/2019 (wrist monitor). She denies any dizziness, headaches, chest pain, edema.  H/oGI bleed 01/2018. At that time, EGDshowed 6cm hiatal hernia, ulcers, gastric erosions, Schatzki's ring which was dilated, benign gastric nodules and chronic peptic duodenitis. On colonoscopy she had64mm adenomatous polyp (and 2 hyperplastic ones). 2 year f/u colonoscopywas recommended.She had repeat colonoscopy in June 2021.  She had a sessile serrated adenoma.  Repeat colonoscopy recommended 5 years. Denies further bleeding or recurrent anemia. She donates blood fairly regularly and hemoglobin has been fine. She had changed her MVI to one with iron.  She was told to stay on PPI due to hiatal hernia. She is prescribed Dexilant (Pantoprazole caused diarrhea).  She doesn't take it every day,uses it just as needed, about once a week.  She reports her GI is aware of her prn usage.  Denies chest or abdominal pain. Denies hematochezia, melena, diarrhea, constipation.  Hypothyroidism: Treated and monitored by Dr. Chalmers Cater. No changes in  hair/skin/moods/energy. No change in dose at her last visit.  HoVitamin D deficiency. Level was normal at 41 in 09/2020.  At that time she was taking MVI daily, and only occasionally taking a separate D. Prior level was 41 inJanuary2018 when taking a daily supplement(1000 IU) plus a MVI daily. She is currently taking MVI and D3 1000 IU every day.  Obesity:  Water aerobics 3x/week and exercise bike when not swimming x 30 minutes 3-4x/week. She continues to try and limit her dairy and bread, eating more fruit/vegetables and lean proteins. She continues to try and not eat after 8pm, limit snacking. She denies any change in diet in the last 6 months. Thinks portions are too high, but is eating the right foods.   PMH, PSH, SH reviewed  Outpatient Encounter Medications as of 04/23/2021  Medication Sig Note  . amLODipine (NORVASC) 5 MG tablet TAKE ONE TABLET BY MOUTH DAILY   . levothyroxine (SYNTHROID, LEVOTHROID) 125 MCG tablet Take 125 mcg by mouth daily.   . Multiple Vitamins-Minerals (MULTIVITAMIN PO) Take 1 tablet by mouth daily. 01/05/2020: Has iron added in  . vitamin C (ASCORBIC ACID) 500 MG tablet Take 500 mg by mouth daily.   . Vitamin D, Cholecalciferol, 1000 UNITS TABS Take 1,000 mg by mouth daily. Reported on 12/24/2015   . acetaminophen (TYLENOL) 325 MG tablet Take 650 mg by mouth every 6 (six) hours as needed for mild pain or headache. (Patient not taking: No sig reported)   . DEXILANT 60 MG capsule Take 60 mg by mouth daily as needed ('feels a hernia').  (Patient not taking: No sig reported) 05/28/2020: As needed  No facility-administered encounter medications on file as of 04/23/2021.   Allergies  Allergen Reactions  . Adhesive [Tape]   . Lisinopril-Hydrochlorothiazide Rash    ROS:  No fever, chills, URI symptoms, cough, shortness of breath, headaches, dizziness, chest pain, edema.  No GI complaints or GU complaints.  See HPI. No bleeding, bruising, rash.   PHYSICAL  EXAM:  BP 134/84   Pulse 60   Ht 5\' 7"  (1.702 m)   Wt 261 lb 12.8 oz (118.8 kg)   BMI 41.00 kg/m   Wt Readings from Last 3 Encounters:  04/23/21 261 lb 12.8 oz (118.8 kg)  10/22/20 262 lb (118.8 kg)  05/28/20 260 lb 6.4 oz (118.1 kg)   Pleasant female, in good spirits, in no distress HEENT: conjunctiva and sclera are clear, EOMI. Wearing mask Neck: no lymphadenopathy. Thyroid enlarged, R>L, nontender. No carotid bruit Heart: regular rate and rhythm, trace murmur noted at RUSB. Lungs: clear bilaterally Back: no spinal or CVA tenderness Abdomen: obese, soft, nontender, no abdominal bruit Extremities: no pitting edema Skin: normal turgor, no visible rash. Bruise at her right upper arm (hit by a door) and R lower leg. Psych: normal mood, affect, hygiene and grooming Neuro: alert and oriented, normal gait, strength.   ASSESSMENT/PLAN:  Essential hypertension, benign - controlled, cont current regimen. Cont low Na diet, exercise. Wt loss rec  Severe obstructive sleep apnea - compliant with CPAP, continue.  Wt loss recommended  Vitamin D deficiency - adequately replaced per last check. Continue supplements  Hiatal hernia - uses PPI prn, about once a week. Reviewed her lack of sx (until symptomatic anemia) related to GI bleed, verify with GI no regular med needed  Need for COVID-19 vaccine - Plan: PFIZER Comirnaty(GRAY TOP)COVID-19 Vaccine  Class 3 severe obesity due to excess calories with serious comorbidity and body mass index (BMI) of 40.0 to 44.9 in adult [facility]) - counseled re: risks of obesity. Pt with HTN, GERD, OSA. Counseled re: wt loss and treatment options. For now, rec tracking w/ MFP (portion size accountability)   F/u as scheduled for CPE/AWV in 10/2021  I spent 43 minutes dedicated to the care of this patient, including pre-visit review of records, face to face time, post-visit ordering of testing and documentation.

## 2021-04-23 ENCOUNTER — Ambulatory Visit: Payer: Medicare PPO | Admitting: Family Medicine

## 2021-04-23 ENCOUNTER — Other Ambulatory Visit: Payer: Self-pay

## 2021-04-23 ENCOUNTER — Encounter: Payer: Self-pay | Admitting: Family Medicine

## 2021-04-23 VITALS — BP 134/84 | HR 60 | Ht 67.0 in | Wt 261.8 lb

## 2021-04-23 DIAGNOSIS — Z6841 Body Mass Index (BMI) 40.0 and over, adult: Secondary | ICD-10-CM | POA: Diagnosis not present

## 2021-04-23 DIAGNOSIS — K449 Diaphragmatic hernia without obstruction or gangrene: Secondary | ICD-10-CM

## 2021-04-23 DIAGNOSIS — I10 Essential (primary) hypertension: Secondary | ICD-10-CM | POA: Diagnosis not present

## 2021-04-23 DIAGNOSIS — Z23 Encounter for immunization: Secondary | ICD-10-CM | POA: Diagnosis not present

## 2021-04-23 DIAGNOSIS — E559 Vitamin D deficiency, unspecified: Secondary | ICD-10-CM | POA: Diagnosis not present

## 2021-04-23 DIAGNOSIS — G4733 Obstructive sleep apnea (adult) (pediatric): Secondary | ICD-10-CM

## 2021-04-23 NOTE — Patient Instructions (Addendum)
  Go through your monitor's memory the night before your next visit, write down the values and bring the list to your visit. Versus fill out the paper given and bring that list instead.  In general, you don't need regular iron supplementation unless it is due to giving blood regularly.  If you decide to stop donating blood, then you should switch to a multivitamin without iron.   I encourage you to restart MyfitnessPal to help with calorie tracking and portion control.  It will help keep you accountable. If this isn't successful, consider re-visiting Weight Watchers versus considering other programs, including [facility] through [place], or [place].

## 2021-06-14 ENCOUNTER — Other Ambulatory Visit: Payer: Self-pay | Admitting: Family Medicine

## 2021-06-14 DIAGNOSIS — I10 Essential (primary) hypertension: Secondary | ICD-10-CM

## 2021-07-24 DIAGNOSIS — G4733 Obstructive sleep apnea (adult) (pediatric): Secondary | ICD-10-CM | POA: Diagnosis not present

## 2021-09-22 ENCOUNTER — Other Ambulatory Visit: Payer: Self-pay | Admitting: Family Medicine

## 2021-09-22 DIAGNOSIS — Z1231 Encounter for screening mammogram for malignant neoplasm of breast: Secondary | ICD-10-CM

## 2021-10-16 DIAGNOSIS — E039 Hypothyroidism, unspecified: Secondary | ICD-10-CM | POA: Diagnosis not present

## 2021-10-19 ENCOUNTER — Ambulatory Visit
Admission: RE | Admit: 2021-10-19 | Discharge: 2021-10-19 | Disposition: A | Discharge status: Home / Self Care | Payer: Medicare PPO | Source: Ambulatory Visit | Attending: Endocrinology | Admitting: Endocrinology

## 2021-10-19 ENCOUNTER — Other Ambulatory Visit: Payer: Self-pay

## 2021-10-19 DIAGNOSIS — E041 Nontoxic single thyroid nodule: Secondary | ICD-10-CM | POA: Diagnosis not present

## 2021-10-19 DIAGNOSIS — E049 Nontoxic goiter, unspecified: Secondary | ICD-10-CM

## 2021-10-23 DIAGNOSIS — I10 Essential (primary) hypertension: Secondary | ICD-10-CM | POA: Diagnosis not present

## 2021-10-23 DIAGNOSIS — E049 Nontoxic goiter, unspecified: Secondary | ICD-10-CM | POA: Diagnosis not present

## 2021-10-23 DIAGNOSIS — E039 Hypothyroidism, unspecified: Secondary | ICD-10-CM | POA: Diagnosis not present

## 2021-10-26 ENCOUNTER — Ambulatory Visit
Admission: RE | Admit: 2021-10-26 | Discharge: 2021-10-26 | Disposition: A | Discharge status: Home / Self Care | Payer: Medicare PPO | Source: Ambulatory Visit | Attending: Family Medicine | Admitting: Family Medicine

## 2021-10-26 ENCOUNTER — Other Ambulatory Visit: Payer: Self-pay

## 2021-10-26 DIAGNOSIS — Z1231 Encounter for screening mammogram for malignant neoplasm of breast: Secondary | ICD-10-CM | POA: Diagnosis not present

## 2021-10-28 NOTE — Patient Instructions (Addendum)
  HEALTH MAINTENANCE RECOMMENDATIONS:  It is recommended that you get at least 30 minutes of aerobic exercise at least 5 days/week (for weight loss, you may need as much as 60-90 minutes). This can be any activity that gets your heart rate up. This can be divided in 10-15 minute intervals if needed, but try and build up your endurance at least once a week.  Weight bearing exercise is also recommended twice weekly.  Eat a healthy diet with lots of vegetables, fruits and fiber.  "Colorful" foods have a lot of vitamins (ie green vegetables, tomatoes, red peppers, etc).  Limit sweet tea, regular sodas and alcoholic beverages, all of which has a lot of calories and sugar.  Up to 1 alcoholic drink daily may be beneficial for women (unless trying to lose weight, watch sugars).  Drink a lot of water.  Calcium recommendations are 1200-1500 mg daily (1500 mg for postmenopausal women or women without ovaries), and vitamin D 1000 IU daily.  This should be obtained from diet and/or supplements (vitamins), and calcium should not be taken all at once, but in divided doses.  Monthly self breast exams and yearly mammograms for women over the age of 10 is recommended.  Sunscreen of at least SPF 30 should be used on all sun-exposed parts of the skin when outside between the hours of 10 am and 4 pm (not just when at beach or pool, but even with exercise, golf, tennis, and yard work!)  Use a sunscreen that says "broad spectrum" so it covers both UVA and UVB rays, and make sure to reapply every 1-2 hours.  Remember to change the batteries in your smoke detectors when changing your clock times in the spring and fall. Carbon monoxide detectors are recommended for your home.  Use your seat belt every time you are in a car, and please drive safely and not be distracted with cell phones and texting while driving.   Ms. Omara , Thank you for taking time to come for your Medicare Wellness Visit. I appreciate your ongoing  commitment to your health goals. Please review the following plan we discussed and let me know if I can assist you in the future.   This is a list of the screening recommended for you and due dates:  Health Maintenance  Topic Date Due   COVID-19 Vaccine (5 - Booster for Pfizer series) 06/18/2021   Flu Shot  07/27/2021   Tetanus Vaccine  09/20/2022   Mammogram  10/26/2022   Colon Cancer Screening  06/16/2025   Pneumonia Vaccine  Completed   DEXA scan (bone density measurement)  Completed   Hepatitis C Screening: USPSTF Recommendation to screen - Ages 46-79 yo.  Completed   Zoster (Shingles) Vaccine  Completed   HPV Vaccine  Aged Out   Please get [place] a copy of your completed/notarized forms (living will and healthcare power of attorney) at your convenience.

## 2021-10-28 NOTE — Progress Notes (Signed)
Chief Complaint  Patient presents with   Medicare Wellness    Fasting (blood in lab) AWV/CPE with pelvic. No concerns. Has eye appt with Dr. Idolina Primer to check on her cataracts. Just saw Dr. Chalmers Cater and had levothyroxine changed, has 6 week follow up in Dec. And again in the spring for repeat u/s. Has POA paperwork from last year, does not need new-just needs to fil out and have notarized.     April Garcia is a 65 y.o. female who presents for annual physical exam, Medicare AWV, and follow-up on chronic medical conditions.    OSA: She reports compliance with CPAP. She switched up her equipment through [employer], still sometimes has some leaks (sees on reports, she isn't aware of a problem). Sees about 3 events/hour pretty consistently. She continues to note less daytime somnolence, feels refreshed in the mornings. She generally sleeps well through the night, sometimes up once to void.   Hypertension follow-up. She reports compliance with amlodipine, and denies any side effects. BP has been running 106-124/58-70 over the last week at home. Monitor was verified as accurate 12/2019 (wrist monitor). She denies any dizziness, headaches, chest pain, edema.   H/o GI bleed 01/2018 (EGD showed 6cm hiatal hernia, ulcers, gastric erosions, Schatzki's ring which was dilated, benign gastric nodules and chronic peptic duodenitis). On colonoscopy she had 24mm adenomatous polyp (and 2 hyperplastic ones). She had repeat colonoscopy in June 2021, and had a sessile serrated adenoma.  Repeat colonoscopy recommended 5 years. Denies further bleeding or recurrent anemia. She donates blood fairly regularly and hemoglobin has been fine (though sometimes has to be checked twice, borderline). She had changed her MVI to one with iron but admits she isn't consistent in taking it.   She was previously told to stay on PPI due to hiatal hernia.  She is prescribed Dexilant (Pantoprazole caused diarrhea).  She has only been using  it prn for quite some time, reports that her GI is aware that she doesn't take it daily.  She uses it a few times/month, depending on what she eats.  Denies chest or abdominal pain. Denies hematochezia, melena, diarrhea, constipation.   Hypothyroidism:  Treated and monitored by Dr. Chalmers Cater.  No changes in hair/skin/moods/energy. She was seen 09/2021, TSH was 2.67.  She was told that Dr. Chalmers Cater said the nodule increased in size, so she increased the dose from 125 to 137 mcg. She states the plan is to recheck TSH in December and have another [place] in 6 months. This is a bit of a contrast to the report that I see. Thyroid US 09/2021: IMPRESSION: TI-RADS 3 right thyroid nodule measuring 1.5 x 1.3 x 0.9 cm on the current examination does not demonstrate significant growth since with 10/24/2018. A follow-up ultrasound should be performed in 2 years to document 5 years of stability.   Ho Vitamin D deficiency. Level was normal at 33.9 in 09/2020.  At that time she was taking MVI daily, and only occasionally taking a separate D. Prior level was 41 in January 2018 when taking a daily supplement (1000 IU) plus a MVI daily.  She currently hasn't been taking MVI regularly or any vitamin D.   Obesity: She continues to try and limit her dairy and bread, eating more fruit/vegetables and lean proteins. She continues to try and not eat after 8pm, limits snacking. She goes to water aerobics 3x/week; she is no longer as consistent with her exercise bike use (had been doing it regularly x 30 mins at  her last visit, on the days she doesn't do water aerobic).  Immunization History  Administered Date(s) Administered   Fluad Quad(high Dose 65+) 10/24/2020, 10/29/2021   Influenza Split 09/26/2012   Influenza,inj,Quad PF,6+ Mos 12/24/2014, 12/24/2015, 10/10/2017, 10/02/2018   Influenza-Unspecified 10/25/2016, 09/16/2019   PFIZER Comirnaty(Gray Top)Covid-19 Tri-Sucrose Vaccine 04/23/2021   PFIZER(Purple Top)SARS-COV-2  Vaccination 02/20/2020, 03/19/2020, 10/24/2020   Pfizer Covid-19 Vaccine Bivalent Booster 66yrs & up 10/29/2021   Pneumococcal Conjugate-13 10/15/2019   Pneumococcal Polysaccharide-23 11/07/2020   Td 02/24/2005   Tdap 09/20/2012   Zoster Recombinat (Shingrix) 10/02/2018, 12/11/2018   Zoster, Live 12/24/2014   Last Pap smear: 2007 (she is s/p hysterectomy) Last mammogram: Monday (10/26/21) Last colonoscopy: 05/2020 (5 year f/u rec) Last DEXA: 11/2019 T-1.3 L fem neck Dentist: every 3-6 months for cleanings Ophtho: regularly, has cataracts they are watching, Exercise:  Water aerobics 3x/week, exercise bike sporadically. Walks dog (very slow, no cardio).  Lipids: Lab Results  Component Value Date   CHOL 160 10/24/2020   HDL 66 10/24/2020   LDLCALC 84 10/24/2020   TRIG 48 10/24/2020   CHOLHDL 2.4 10/24/2020    Patient Care Team: Rita Ohara, MD as PCP - General (Family Medicine) Jacelyn Pi, MD as Referring Physician (Endocrinology) Otis Brace, MD as Consulting Physician (Gastroenterology) Idolina Primer, [facility]) Rolm Bookbinder, MD as Consulting Physician (Dermatology) Aguero,Beatriz DDS (Dentistry) Dr. Linus Mako, MD as Consulting Physician (Vascular Surgery) Veins: Dr. Renaldo Reel  Depression Screening: Flowsheet Row Office Visit from 10/29/2021 in [facility]  PHQ-2 Total Score 0        Falls screen:  Fall Risk  10/29/2021 04/23/2021 10/22/2020 10/15/2019 12/24/2015  Falls in the past year? 0 0 0 0 No  Number falls in past yr: 0 0 - 0 -  Injury with Fall? 0 0 - 0 -  Risk for fall due to : No Fall Risks No Fall Risks - - -  Follow up Falls evaluation completed Falls evaluation completed - - -     Functional Status Survey: Is the patient deaf or have difficulty hearing?: No (only with loud background noise) Does the patient have difficulty seeing, even when wearing glasses/contacts?: Yes (cataracts-not quite for surgery) Does the patient  have difficulty concentrating, remembering, or making decisions?: No Does the patient have difficulty walking or climbing stairs?: No Does the patient have difficulty dressing or bathing?: No Does the patient have difficulty doing errands alone such as visiting a doctor's office or shopping?: No  Mini-Cog Scoring: 5    End of Life Discussion:  Patient does not have a living will and medical power of attorney. Forms given last year, still has them at home.   PMH, PSH, SH and FH were reviewed and updated  Outpatient Encounter Medications as of 10/29/2021  Medication Sig Note   amLODipine (NORVASC) 5 MG tablet TAKE ONE TABLET BY MOUTH DAILY    levothyroxine (SYNTHROID) 137 MCG tablet 1 tablet in the morning on an empty stomach    Multiple Vitamins-Minerals (MULTIVITAMIN PO) Take 1 tablet by mouth daily. 10/29/2021: With Iron-takes about 3 times a week   vitamin C (ASCORBIC ACID) 500 MG tablet Take 500 mg by mouth daily. 10/29/2021: Takes about 3 times a week   acetaminophen (TYLENOL) 325 MG tablet Take 650 mg by mouth every 6 (six) hours as needed for mild pain or headache. (Patient not taking: No sig reported) 10/29/2021: As needed   DEXILANT 60 MG capsule Take 60 mg by mouth daily as needed ('feels  a hernia').  (Patient not taking: No sig reported) 05/28/2020: As needed   Vitamin D, Cholecalciferol, 1000 UNITS TABS Take 1,000 mg by mouth daily. Reported on 12/24/2015 (Patient not taking: Reported on 10/29/2021)    [DISCONTINUED] levothyroxine (SYNTHROID, LEVOTHROID) 125 MCG tablet Take 125 mcg by mouth daily.    No facility-administered encounter medications on file as of 10/29/2021.   Allergies  Allergen Reactions   Adhesive [Tape]    Lisinopril-Hydrochlorothiazide Rash    ROS:  The patient denies anorexia, fever, headaches,  vision changes (cataracts noted by ophtho, slight decline noted), decreased hearing, ear pain, sore throat, breast concerns, chest pain, palpitations, dizziness,  syncope, dyspnea on exertion, cough, swelling, nausea, vomiting, diarrhea, constipation, abdominal pain, melena, hematochezia, indigestion/heartburn, hematuria, incontinence, dysuria, vaginal bleeding, discharge, odor or itch, genital lesions, joint pains, numbness, tingling, weakness, tremor, suspicious skin lesions, depression, anxiety, abnormal bleeding/bruising, or enlarged lymph nodes.    PHYSICAL EXAM:  BP 120/70   Pulse 72   Ht 5\' 7"  (1.702 m)   Wt 255 lb (115.7 kg)   BMI 39.94 kg/m   Wt Readings from Last 3 Encounters:  10/29/21 255 lb (115.7 kg)  04/23/21 261 lb 12.8 oz (118.8 kg)  10/22/20 262 lb (118.8 kg)   General Appearance:      Alert, cooperative, no distress, appears stated age.  Head:      Normocephalic, without obvious abnormality, atraumatic    Eyes:      PERRL, conjunctiva/corneas clear, EOM's intact, fundi not well visualized, cataracts L>R  Ears:      Normal TM's and external ear canals    Nose:     Not examined, wearing mask due to COVID-19 pandemic  Throat:     Not examined, wearing mask due to COVID-19 pandemic    Neck:     Supple, no mass, lymphadenopathy. Thyroid enlarged, more prominent on the right, nontender; no carotid bruit or JVD.   Back:      Spine nontender, no curvature, ROM normal, no CVA     tenderness    Lungs:       Clear to auscultation bilaterally without wheezes, rales or ronchi; respirations unlabored    Chest Wall:      No tenderness or deformity     Heart:      Regular rate and rhythm, S1 and S2 normal, no murmur, rub   or gallop    Breast Exam:      No tenderness, masses, or nipple discharge or inversion. Small skin tag at left nipple. WHSS at R breast.  No axillary lymphadenopathy    Abdomen:       Soft, non-tender, obese, nondistended, normoactive bowel sounds, no masses, no hepatosplenomegaly    Genitalia:      Normal external genitalia without lesions.  BUS and vagina normal; surgically absent uterus.  No adnexal masses or tenderness,  though exam is limited by body habitus.  Pap not performed.   Rectal:    Normal sphincter tone, no masses.  Heme negative stool  Extremities:     No clubbing, cyanosis or edema  Pulses:     2+ and symmetric all extremities    Skin:     Skin color, texture, turgor normal.  Scattered skin tags and SK's present.  Onychomycosis of both feet toenails, some nails missing (great toenails and pinkies, although the L great toenail has some re-growth).  Tubers present at L great toe, 5th toe, L 3rd finger below the nail. Erythema noted below  both breasts--slight fissure/denuded skin at medial aspect on R side.  No satellite lesions. No drainage/crusting or soft tissue swelling.  There is also some erythema at the skin fold at the L lateral abdomen.  Lymph nodes:     Cervical, supraclavicular, inguinal and axillary nodes normal    Neurologic:     Normal strength, sensation and gait; reflexes 2+ and symmetric throughout                       Psych:   Normal mood, affect, hygiene and grooming    ASSESSMENT/PLAN:  Annual physical exam - Plan: Comprehensive metabolic panel, CBC with Differential/Platelet  Medicare annual wellness visit, initial  Essential hypertension, benign - controlled, continue amlodipine - Plan: Comprehensive metabolic panel  Severe obstructive sleep apnea - cont CPAP; wt loss rec  Vitamin D deficiency - Recheck level, encouraged compliance with supplements - Plan: VITAMIN D 25 Hydroxy (Vit-D Deficiency, Fractures)  Hypothyroidism, unspecified type - under care of endo; recent dose adjustment, and she has f/u TSH scheduled  Need for influenza vaccination - Plan: Flu Vaccine QUAD High Dose(Fluad)  Need for COVID-19 vaccine - Plan: Pension scheme manager  Medication monitoring encounter - Plan: Comprehensive metabolic panel, CBC with Differential/Platelet, VITAMIN D 25 Hydroxy (Vit-D Deficiency, Fractures)  Class 2 severe obesity due to excess calories with  serious comorbidity and body mass index (BMI) of 39.0 to 39.9 in adult ([place]) - Counseled re: diet, exercise, risks   Discussed monthly self breast exams and yearly mammograms; at least 30 minutes of aerobic activity at least 5 days/week and weight-bearing exercise 2x/week; proper sunscreen use reviewed; healthy diet, including goals of calcium and vitamin D intake and alcohol recommendations (less than or equal to 1 drink/day) reviewed; regular seatbelt use; changing batteries in smoke detectors, use of carbon monoxide detectors.  Immunization recommendations discussed; high dose flu shot and bivalent COVID booster given today.  Tdap will be due next year, to get from pharmacy.  Colonoscopy recommendations reviewed, due again 05/2025  MOST form reviewed/updated, full code, full care. Living Will and Healthcare power of attorney discussed, paperwork was given last year. Reminded to get [place] a copy to be scanned into chart once she has completed them.    Medicare Attestation I have personally reviewed: The patient's medical and social history Their use of alcohol, tobacco or illicit drugs Their current medications and supplements The patient's functional ability including ADLs,fall risks, home safety risks, cognitive, and hearing and visual impairment Diet and physical activities Evidence for depression or mood disorders  The patient's weight, height, BMI have been recorded in the chart.  I have made referrals, counseling, and provided education to the patient based on review of the above and I have provided the patient with a written personalized care plan for preventive services.     Vikki Ports, MD

## 2021-10-29 ENCOUNTER — Encounter: Payer: Self-pay | Admitting: Family Medicine

## 2021-10-29 ENCOUNTER — Ambulatory Visit (INDEPENDENT_AMBULATORY_CARE_PROVIDER_SITE_OTHER): Payer: Medicare PPO | Admitting: Family Medicine

## 2021-10-29 ENCOUNTER — Other Ambulatory Visit: Payer: Self-pay

## 2021-10-29 VITALS — BP 120/70 | HR 72 | Ht 67.0 in | Wt 255.0 lb

## 2021-10-29 DIAGNOSIS — Z6839 Body mass index (BMI) 39.0-39.9, adult: Secondary | ICD-10-CM | POA: Diagnosis not present

## 2021-10-29 DIAGNOSIS — E039 Hypothyroidism, unspecified: Secondary | ICD-10-CM

## 2021-10-29 DIAGNOSIS — Z5181 Encounter for therapeutic drug level monitoring: Secondary | ICD-10-CM

## 2021-10-29 DIAGNOSIS — Z23 Encounter for immunization: Secondary | ICD-10-CM

## 2021-10-29 DIAGNOSIS — E559 Vitamin D deficiency, unspecified: Secondary | ICD-10-CM

## 2021-10-29 DIAGNOSIS — G4733 Obstructive sleep apnea (adult) (pediatric): Secondary | ICD-10-CM | POA: Diagnosis not present

## 2021-10-29 DIAGNOSIS — I10 Essential (primary) hypertension: Secondary | ICD-10-CM | POA: Diagnosis not present

## 2021-10-29 DIAGNOSIS — Z0000 Encounter for general adult medical examination without abnormal findings: Secondary | ICD-10-CM

## 2021-10-29 DIAGNOSIS — K449 Diaphragmatic hernia without obstruction or gangrene: Secondary | ICD-10-CM

## 2021-10-30 LAB — COMPREHENSIVE METABOLIC PANEL
ALT: 10 IU/L (ref 0–32)
AST: 13 IU/L (ref 0–40)
Albumin/Globulin Ratio: 1.8 (ref 1.2–2.2)
Albumin: 4.4 g/dL (ref 3.8–4.8)
Alkaline Phosphatase: 101 IU/L (ref 44–121)
BUN/Creatinine Ratio: 16 (ref 12–28)
BUN: 12 mg/dL (ref 8–27)
Bilirubin Total: 0.3 mg/dL (ref 0.0–1.2)
CO2: 22 mmol/L (ref 20–29)
Calcium: 9.4 mg/dL (ref 8.7–10.3)
Chloride: 104 mmol/L (ref 96–106)
Creatinine, Ser: 0.76 mg/dL (ref 0.57–1.00)
Globulin, Total: 2.5 g/dL (ref 1.5–4.5)
Glucose: 95 mg/dL (ref 70–99)
Potassium: 4.1 mmol/L (ref 3.5–5.2)
Sodium: 139 mmol/L (ref 134–144)
Total Protein: 6.9 g/dL (ref 6.0–8.5)
eGFR: 86 mL/min/{1.73_m2} (ref >59)

## 2021-10-30 LAB — CBC WITH DIFFERENTIAL/PLATELET
Basophils Absolute: 0 10*3/uL (ref 0.0–0.2)
Basos: 0 %
EOS (ABSOLUTE): 0.2 10*3/uL (ref 0.0–0.4)
Eos: 4 %
Hematocrit: 40.1 % (ref 34.0–46.6)
Hemoglobin: 12.3 g/dL (ref 11.1–15.9)
Immature Grans (Abs): 0 10*3/uL (ref 0.0–0.1)
Immature Granulocytes: 0 %
Lymphocytes Absolute: 1.4 10*3/uL (ref 0.7–3.1)
Lymphs: 22 %
MCH: 24.5 pg — ABNORMAL LOW (ref 26.6–33.0)
MCHC: 30.7 g/dL — ABNORMAL LOW (ref 31.5–35.7)
MCV: 80 fL (ref 79–97)
Monocytes Absolute: 0.5 10*3/uL (ref 0.1–0.9)
Monocytes: 7 %
Neutrophils Absolute: 4.3 10*3/uL (ref 1.4–7.0)
Neutrophils: 67 %
Platelets: 226 10*3/uL (ref 150–450)
RBC: 5.03 x10E6/uL (ref 3.77–5.28)
RDW: 18.1 % — ABNORMAL HIGH (ref 11.7–15.4)
WBC: 6.4 10*3/uL (ref 3.4–10.8)

## 2021-10-30 LAB — VITAMIN D 25 HYDROXY (VIT D DEFICIENCY, FRACTURES): Vit D, 25-Hydroxy: 37 ng/mL (ref 30.0–100.0)

## 2021-11-09 DIAGNOSIS — H2513 Age-related nuclear cataract, bilateral: Secondary | ICD-10-CM | POA: Diagnosis not present

## 2021-11-09 DIAGNOSIS — H35033 Hypertensive retinopathy, bilateral: Secondary | ICD-10-CM | POA: Diagnosis not present

## 2021-12-04 DIAGNOSIS — E039 Hypothyroidism, unspecified: Secondary | ICD-10-CM | POA: Diagnosis not present

## 2021-12-09 ENCOUNTER — Other Ambulatory Visit: Payer: Self-pay | Admitting: Family Medicine

## 2021-12-09 DIAGNOSIS — I10 Essential (primary) hypertension: Secondary | ICD-10-CM

## 2022-01-07 DIAGNOSIS — H25013 Cortical age-related cataract, bilateral: Secondary | ICD-10-CM | POA: Diagnosis not present

## 2022-01-07 DIAGNOSIS — H18413 Arcus senilis, bilateral: Secondary | ICD-10-CM | POA: Diagnosis not present

## 2022-01-07 DIAGNOSIS — H2513 Age-related nuclear cataract, bilateral: Secondary | ICD-10-CM | POA: Diagnosis not present

## 2022-01-07 DIAGNOSIS — H25043 Posterior subcapsular polar age-related cataract, bilateral: Secondary | ICD-10-CM | POA: Diagnosis not present

## 2022-02-17 DIAGNOSIS — H25043 Posterior subcapsular polar age-related cataract, bilateral: Secondary | ICD-10-CM | POA: Diagnosis not present

## 2022-02-17 DIAGNOSIS — H2513 Age-related nuclear cataract, bilateral: Secondary | ICD-10-CM | POA: Diagnosis not present

## 2022-02-17 DIAGNOSIS — H18413 Arcus senilis, bilateral: Secondary | ICD-10-CM | POA: Diagnosis not present

## 2022-02-17 DIAGNOSIS — H25013 Cortical age-related cataract, bilateral: Secondary | ICD-10-CM | POA: Diagnosis not present

## 2022-03-04 ENCOUNTER — Other Ambulatory Visit: Payer: Self-pay | Admitting: Endocrinology

## 2022-03-04 DIAGNOSIS — E049 Nontoxic goiter, unspecified: Secondary | ICD-10-CM

## 2022-03-10 ENCOUNTER — Other Ambulatory Visit: Payer: Self-pay

## 2022-03-10 ENCOUNTER — Ambulatory Visit
Admission: RE | Admit: 2022-03-10 | Discharge: 2022-03-10 | Disposition: A | Discharge status: Home / Self Care | Payer: Medicare PPO | Source: Ambulatory Visit | Attending: Endocrinology | Admitting: Endocrinology

## 2022-03-10 DIAGNOSIS — E041 Nontoxic single thyroid nodule: Secondary | ICD-10-CM | POA: Diagnosis not present

## 2022-03-10 DIAGNOSIS — E049 Nontoxic goiter, unspecified: Secondary | ICD-10-CM

## 2022-03-29 DIAGNOSIS — H25811 Combined forms of age-related cataract, right eye: Secondary | ICD-10-CM | POA: Diagnosis not present

## 2022-03-30 DIAGNOSIS — H2512 Age-related nuclear cataract, left eye: Secondary | ICD-10-CM | POA: Diagnosis not present

## 2022-03-30 DIAGNOSIS — H2511 Age-related nuclear cataract, right eye: Secondary | ICD-10-CM | POA: Diagnosis not present

## 2022-04-05 DIAGNOSIS — H25012 Cortical age-related cataract, left eye: Secondary | ICD-10-CM | POA: Diagnosis not present

## 2022-04-06 DIAGNOSIS — H2512 Age-related nuclear cataract, left eye: Secondary | ICD-10-CM | POA: Diagnosis not present

## 2022-04-23 DIAGNOSIS — E039 Hypothyroidism, unspecified: Secondary | ICD-10-CM | POA: Diagnosis not present

## 2022-04-30 DIAGNOSIS — I10 Essential (primary) hypertension: Secondary | ICD-10-CM | POA: Diagnosis not present

## 2022-04-30 DIAGNOSIS — E049 Nontoxic goiter, unspecified: Secondary | ICD-10-CM | POA: Diagnosis not present

## 2022-04-30 DIAGNOSIS — E039 Hypothyroidism, unspecified: Secondary | ICD-10-CM | POA: Diagnosis not present

## 2022-06-04 ENCOUNTER — Other Ambulatory Visit: Payer: Self-pay | Admitting: Family Medicine

## 2022-06-04 DIAGNOSIS — I10 Essential (primary) hypertension: Secondary | ICD-10-CM

## 2022-10-12 ENCOUNTER — Other Ambulatory Visit: Payer: Self-pay | Admitting: Family Medicine

## 2022-10-12 DIAGNOSIS — Z1231 Encounter for screening mammogram for malignant neoplasm of breast: Secondary | ICD-10-CM

## 2022-11-07 IMAGING — US US THYROID
1 series · 13 of 25 positions shown · non-contrast
Comparison: 10/17/2020

CLINICAL DATA: Nontoxic goiter

EXAM:
THYROID ULTRASOUND
TECHNIQUE: Ultrasound examination of the thyroid gland and adjacent soft
tissues was performed.

[Series 1: us thyroid · 0.05mm/px · 13 of 54 slices shown]
[im 1/54]
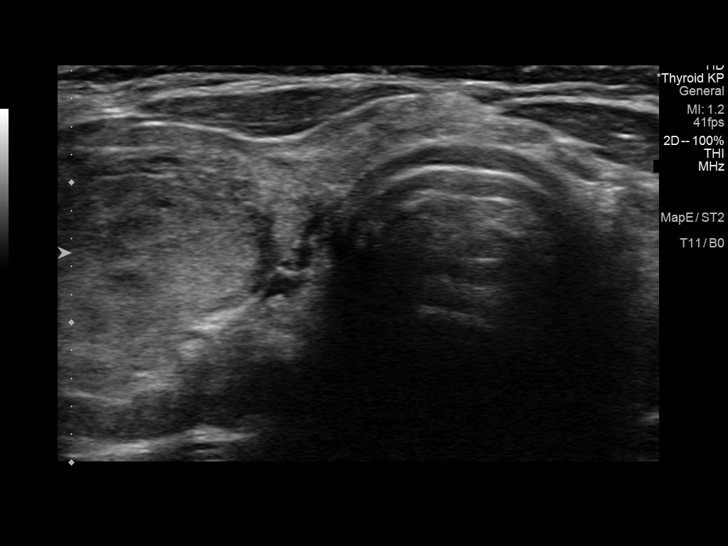
[im 5/54]
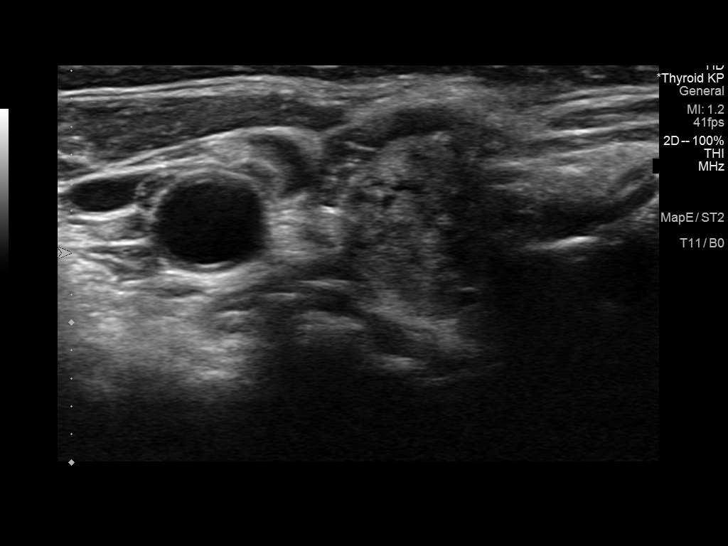
[im 9/54]
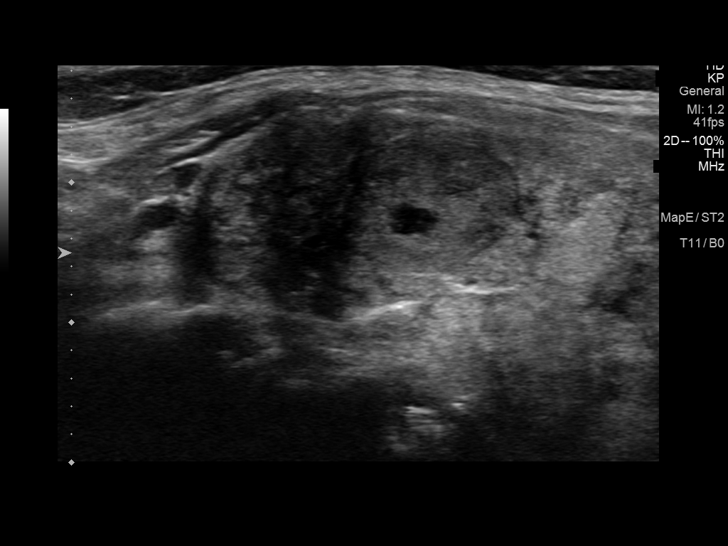
[im 14/54]
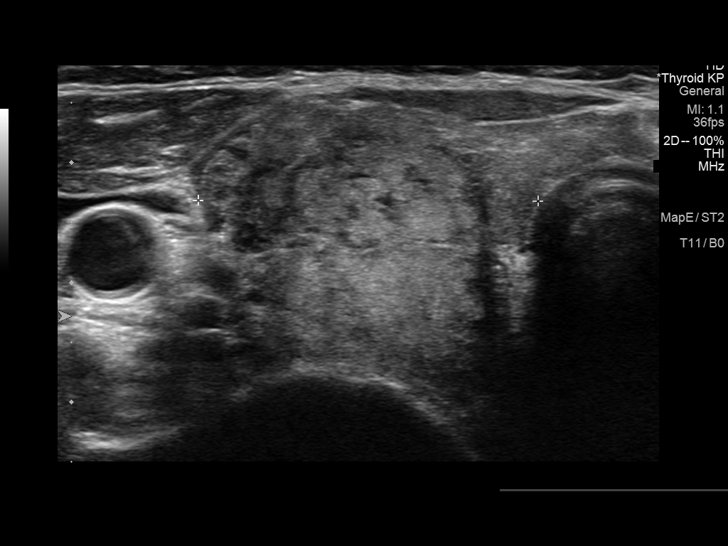
[im 18/54]
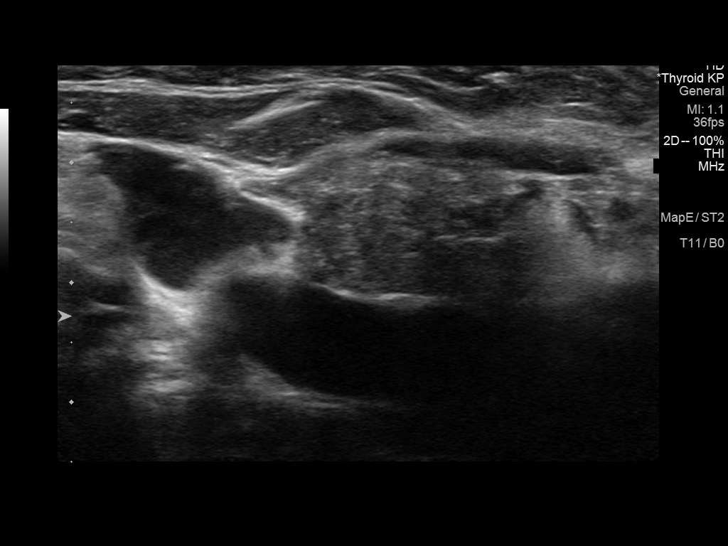
[im 23/54]
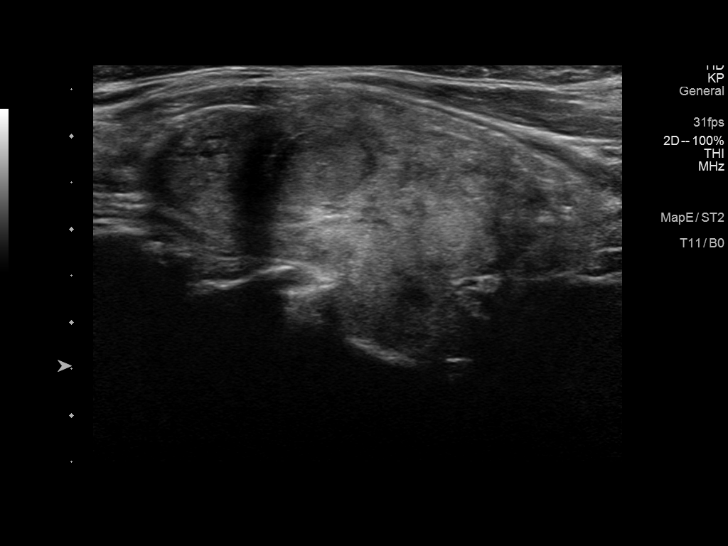
[im 27/54]
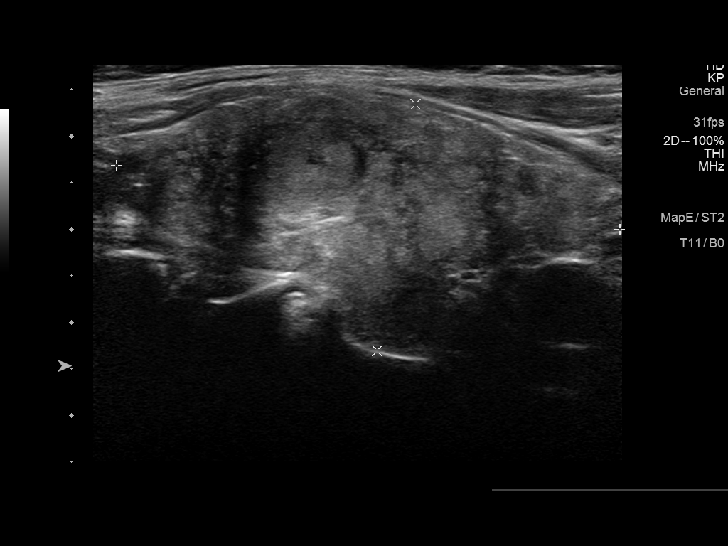
[im 31/54]
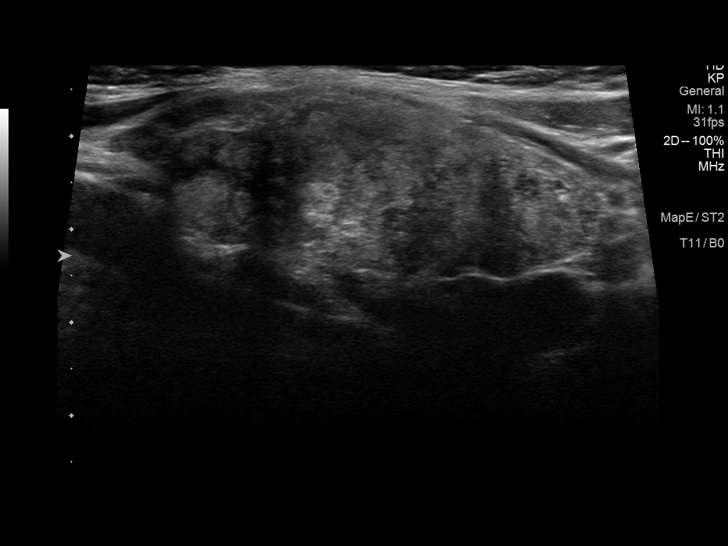
[im 36/54]
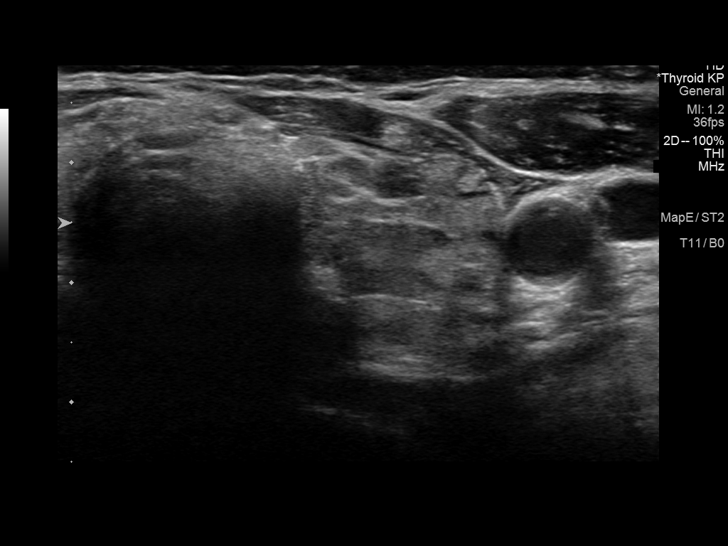
[im 40/54]
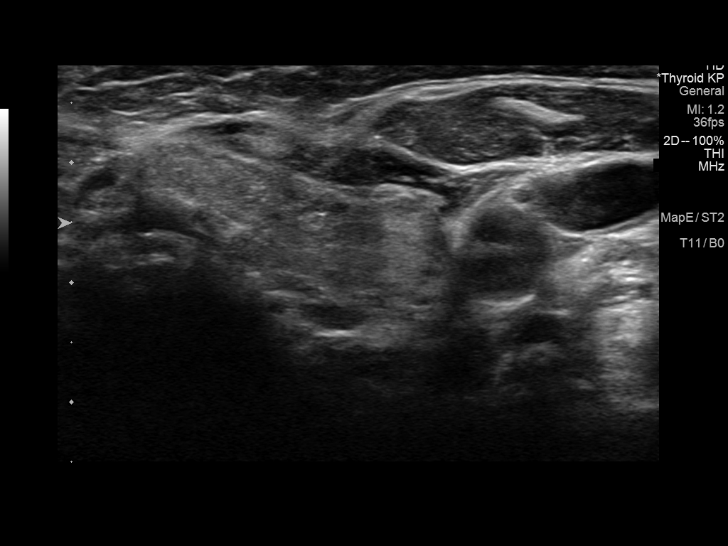
[im 45/54]
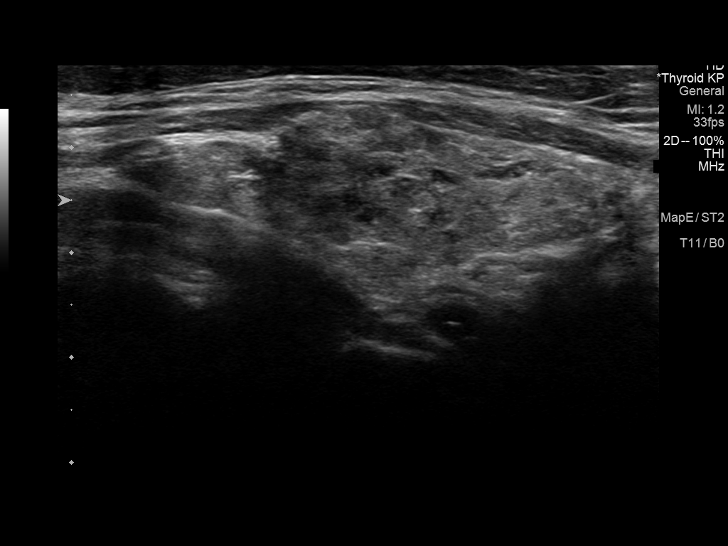
[im 49/54]
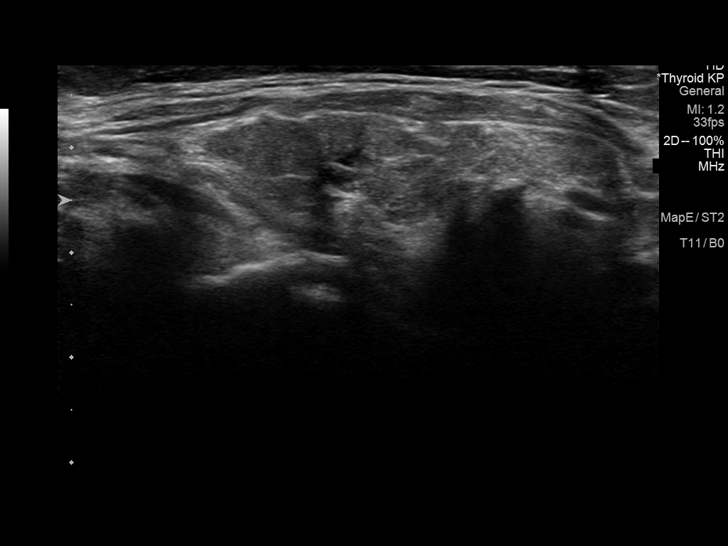
[im 54/54]
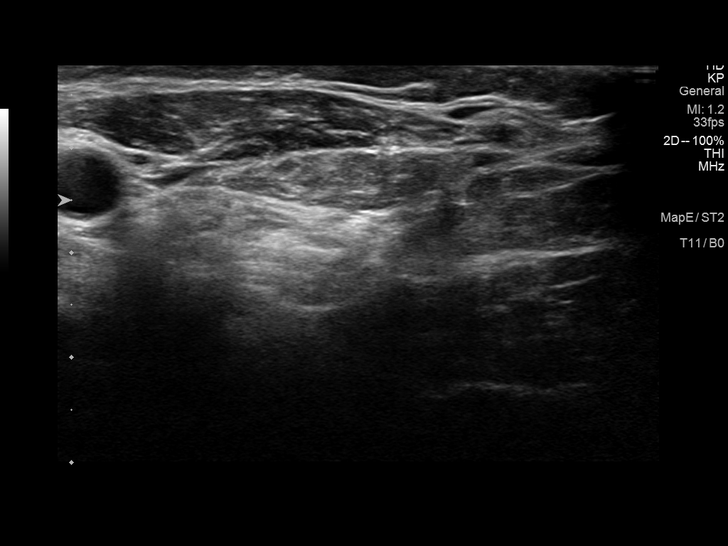

[13 of 25 positions shown; findings below may reference images not displayed]

FINDINGS: Parenchymal Echotexture: Moderately heterogeneous

Isthmus: 0.5 cm

Right lobe: 5.4 x 2.7 x 2.2 cm

Left lobe: 4.9 x 1.8 x 1.8 cm

_________________________________________________________

Estimated total number of nodules >/= 1 cm: 1

Number of spongiform nodules >/=  2 cm not described below (TR1): 0

Number of mixed cystic and solid nodules >/= 1.5 cm not described
below (TR2): 0

_________________________________________________________

Nodule # 1:

Prior biopsy: No

Location: Right; superior

Maximum size: 1.5 cm; Other 2 dimensions: 1.3 x 0.9 cm, previously,
1.4 x 1.0 x 1.2 cm on 10/24/2018

Composition: solid/almost completely solid (2)

Echogenicity: isoechoic (1)

Shape: not taller-than-wide (0)

Margins: smooth (0)

Echogenic foci: none (0)

ACR TI-RADS total points: 3.

ACR TI-RADS risk category:  TR3 (3 points).

Significant change in size (>/= 20% in two dimensions and minimal
increase of 2 mm): No

Change in features: No

Change in ACR TI-RADS risk category: No

ACR TI-RADS recommendations:

*Given size (>/= 1.5 - 2.4 cm) and appearance, a follow-up
ultrasound in 1 year should be considered based on TI-RADS criteria.

_________________________________________________________
IMPRESSION: TI-RADS 3 right thyroid nodule measuring 1.5 x 1.3 x 0.9 cm on the
current examination does not demonstrate significant growth since
with 10/24/2018. A follow-up ultrasound should be performed in 2
years to document 5 years of stability.

The above is in keeping with the ACR TI-RADS recommendations - [HOSPITAL] 6652;[DATE].

## 2022-11-08 ENCOUNTER — Ambulatory Visit: Payer: Medicare PPO | Admitting: Family Medicine

## 2022-11-29 ENCOUNTER — Ambulatory Visit
Admission: RE | Admit: 2022-11-29 | Discharge: 2022-11-29 | Disposition: A | Discharge status: Home / Self Care | Payer: Medicare PPO | Source: Ambulatory Visit | Attending: Family Medicine | Admitting: Family Medicine

## 2022-11-29 ENCOUNTER — Ambulatory Visit: Payer: Medicare PPO

## 2022-11-29 ENCOUNTER — Other Ambulatory Visit: Payer: Self-pay | Admitting: Family Medicine

## 2022-11-29 DIAGNOSIS — Z1231 Encounter for screening mammogram for malignant neoplasm of breast: Secondary | ICD-10-CM

## 2022-11-29 DIAGNOSIS — I10 Essential (primary) hypertension: Secondary | ICD-10-CM

## 2022-11-29 NOTE — Telephone Encounter (Signed)
Left message for patient asking her to please call back to r/s AWV that was cx in Nov so I can go ahead and refill this medication.

## 2022-12-28 ENCOUNTER — Other Ambulatory Visit: Payer: Self-pay | Admitting: Family Medicine

## 2022-12-28 DIAGNOSIS — I10 Essential (primary) hypertension: Secondary | ICD-10-CM

## 2023-01-10 ENCOUNTER — Other Ambulatory Visit: Payer: Self-pay | Admitting: Family Medicine

## 2023-01-10 DIAGNOSIS — I10 Essential (primary) hypertension: Secondary | ICD-10-CM

## 2023-01-27 HISTORY — PX: CATARACT EXTRACTION, BILATERAL: SHX1313

## 2023-03-08 ENCOUNTER — Other Ambulatory Visit: Payer: Self-pay | Admitting: Family Medicine

## 2023-03-08 DIAGNOSIS — I10 Essential (primary) hypertension: Secondary | ICD-10-CM

## 2023-03-11 NOTE — Progress Notes (Unsigned)
No chief complaint on file.   April Garcia is a 67 y.o. female who presents for annual physical exam, Medicare AWV, and follow-up on chronic medical conditions.    OSA: She reports compliance with CPAP.  She continues to note less daytime somnolence, feels refreshed in the mornings. She generally sleeps well through the night, sometimes up once to void.   Hypertension follow-up. She reports compliance with amlodipine, and denies any side effects. BP has been running   Monitor was verified as accurate 12/2019 (wrist monitor). She denies any dizziness, headaches, chest pain, edema.   H/o GI bleed 01/2018 (EGD showed 6cm hiatal hernia, ulcers, gastric erosions, Schatzki's ring which was dilated, benign gastric nodules and chronic peptic duodenitis). On colonoscopy she had 72mm adenomatous polyp (and 2 hyperplastic ones). She had repeat colonoscopy in June 2021, and had a sessile serrated adenoma.  Repeat colonoscopy recommended 5 years. Denies further bleeding or recurrent anemia. She donates blood fairly regularly and hemoglobin has been fine (though sometimes has to be checked twice, borderline). She had changed her MVI to one with iron but admits she isn't consistent in taking it. UPDATE  She was previously told to stay on PPI due to hiatal hernia.  She is prescribed Dexilant (Pantoprazole caused diarrhea), but only uses it prn. She reports that her GI is aware that she doesn't take it daily.  She uses it a few times/month, depending on what she eats.  Denies chest or abdominal pain. Denies hematochezia, melena, diarrhea, constipation.   Hypothyroidism:  Treated and monitored by Dr. Chalmers Cater.  No changes in hair/skin/moods/energy. She was last seen 04/2022. TSH was 1.69 in 03/2022. She continues on 137 mcg dose and denies any concerns. She had f/u thyroid US in 02/2022: IMPRESSION: Nodule 1 (TI-RADS 3), measuring 1.7 cm, located in the superior right thyroid lobe, is not significantly  unchanged in size since 10/24/2018. It meets criteria for imaging follow-up. Annual ultrasound surveillance is recommended until 5 years of stability is documented.  Ho Vitamin D deficiency. Last level was 37 in 10/2021, when not taking D or MVI very regularly. She is currently taking   Obesity: She continues to try and limit her dairy and bread, eating more fruit/vegetables and lean proteins. She continues to try and not eat after 8pm, limits snacking. She goes to water aerobics 3x/week; she is no longer as consistent with her exercise bike use    Immunization History  Administered Date(s) Administered   Fluad Quad(high Dose 65+) 10/24/2020, 10/29/2021   Influenza Split 09/26/2012   Influenza,inj,Quad PF,6+ Mos 12/24/2014, 12/24/2015, 10/10/2017, 10/02/2018   Influenza-Unspecified 10/25/2016, 09/16/2019   PFIZER Comirnaty(Gray Top)Covid-19 Tri-Sucrose Vaccine 04/23/2021   PFIZER(Purple Top)SARS-COV-2 Vaccination 02/20/2020, 03/19/2020, 10/24/2020   Pfizer Covid-19 Vaccine Bivalent Booster 9yrs & up 10/29/2021   Pneumococcal Conjugate-13 10/15/2019   Pneumococcal Polysaccharide-23 11/07/2020   Td 02/24/2005   Tdap 09/20/2012   Zoster Recombinat (Shingrix) 10/02/2018, 12/11/2018   Zoster, Live 12/24/2014   Last Pap smear: 2007 (she is s/p hysterectomy) Last mammogram: 11/2022 Last colonoscopy: 05/2020 (5 year f/u rec) Last DEXA: 11/2019 T-1.3 L fem neck Dentist: every 3-6 months for cleanings Ophtho: regularly, has cataracts they are watching, Exercise:  Water aerobics 3x/week, exercise bike sporadically. Walks dog (very slow, no cardio).  Lipids: Lab Results  Component Value Date   CHOL 160 10/24/2020   HDL 66 10/24/2020   LDLCALC 84 10/24/2020   TRIG 48 10/24/2020   CHOLHDL 2.4 10/24/2020    Patient Care Team: Tomi Bamberger,  Tera Helper, MD as PCP - General (Family Medicine) Jacelyn Pi, MD as Referring Physician (Endocrinology) Otis Brace, MD as Consulting Physician  (Gastroenterology) Idolina Primer [facility]ell [facility]) Rolm Bookbinder, MD as Consulting Physician (Dermatology) Aguero,Beatriz DDS (Dentistry) Dr. Linus Mako, MD as Consulting Physician (Vascular Surgery) Veins: Dr. Renaldo Reel  Depression Screening: Flowsheet Row Office Visit from 10/29/2021 in [place]  PHQ-2 Total Score 0        Falls screen:     10/29/2021    1:43 PM 04/23/2021   10:47 AM 10/22/2020    1:52 PM 10/15/2019    1:40 PM 12/24/2015   10:10 AM  [place] in the past year? 0 0 0 0 No  Number falls in past yr: 0 0  0   Injury with Fall? 0 0  0   Risk for fall due to : No Fall Risks No Fall Risks     Follow up Falls evaluation completed Falls evaluation completed        Functional Status Survey:         End of Life Discussion:  Patient does not have a living will and medical power of attorney. Forms given in the past, still has them at home.   PMH, PSH, SH and FH were reviewed and updated    ROS:  The patient denies anorexia, fever, headaches,  vision changes (cataracts noted by ophtho, slight decline noted), decreased hearing, ear pain, sore throat, breast concerns, chest pain, palpitations, dizziness, syncope, dyspnea on exertion, cough, swelling, nausea, vomiting, diarrhea, constipation, abdominal pain, melena, hematochezia, indigestion/heartburn, hematuria, incontinence, dysuria, vaginal bleeding, discharge, odor or itch, genital lesions, joint pains, numbness, tingling, weakness, tremor, suspicious skin lesions, depression, anxiety, abnormal bleeding/bruising, or enlarged lymph nodes.    PHYSICAL EXAM:  There were no vitals taken for this visit.  Wt Readings from Last 3 Encounters:  10/29/21 255 lb (115.7 kg)  04/23/21 261 lb 12.8 oz (118.8 kg)  10/22/20 262 lb (118.8 kg)   General Appearance:      Alert, cooperative, no distress, appears stated age.  Head:      Normocephalic, without obvious abnormality, atraumatic    Eyes:       PERRL, conjunctiva/corneas clear, EOM's intact, fundi not well visualized, cataracts L>R  Ears:      Normal TM's and external ear canals    Nose:     No drainage or sinus tenderness  Throat:     Normal mucosa  Neck:     Supple, no mass, lymphadenopathy. Thyroid enlarged, more prominent on the right, nontender; no carotid bruit or JVD.   Back:      Spine nontender, no curvature, ROM normal, no CVA     tenderness    Lungs:       Clear to auscultation bilaterally without wheezes, rales or ronchi; respirations unlabored    Chest Wall:      No tenderness or deformity     Heart:      Regular rate and rhythm, S1 and S2 normal, no murmur, rub   or gallop    Breast Exam:      No tenderness, masses, or nipple discharge or inversion. Small skin tag at left nipple. WHSS at R breast.  No axillary lymphadenopathy    Abdomen:       Soft, non-tender, obese, nondistended, normoactive bowel sounds, no masses, no hepatosplenomegaly    Genitalia:      Normal external genitalia without lesions.  BUS and vagina normal;  surgically absent uterus.  No adnexal masses or tenderness, though exam is limited by body habitus.  Pap not performed.   Rectal:    Normal sphincter tone, no masses.  Heme negative stool  Extremities:     No clubbing, cyanosis or edema  Pulses:     2+ and symmetric all extremities    Skin:     Skin color, texture, turgor normal.  Scattered skin tags and SK's present.  Onychomycosis of both feet toenails, some nails missing (great toenails and pinkies, although the L great toenail has some re-growth).  Tubers present at L great toe, 5th toe, L 3rd finger below the nail.  Lymph nodes:     Cervical, supraclavicular, inguinal and axillary nodes normal    Neurologic:     Normal strength, sensation and gait; reflexes 2+ and symmetric throughout                       Psych:   Normal mood, affect, hygiene and grooming  ***UPDATE Cataracts L>R? Thyroid exam Skin tag L nipple Onychomycosis--UPDATE nails and  tubers Erythema below breasts or skin folds at abdomen??  ASSESSMENT/PLAN:  Flu, COVID, RSV, TdaP--has she had any of these?  Enter what she has had. Offer COVID booster if appropriate (4 months since last one) Need her living will and healthcare POA  Cbc, c-met, lipids RF amlodipine x year  Discussed monthly self breast exams and yearly mammograms; at least 30 minutes of aerobic activity at least 5 days/week and weight-bearing exercise 2x/week; proper sunscreen use reviewed; healthy diet, including goals of calcium and vitamin D intake and alcohol recommendations (less than or equal to 1 drink/day) reviewed; regular seatbelt use; changing batteries in smoke detectors, use of carbon monoxide detectors.  Immunization recommendations discussed Flu shot COVID booster RSV TdaP  Colonoscopy recommendations reviewed, due again 05/2025  MOST form reviewed/updated, full code, full care. Living Will and Healthcare power of attorney discussed, paperwork was given in the past, not returned. Reminded to get [place] a copy to be scanned into chart once she has completed them.    Medicare Attestation I have personally reviewed: The patient's medical and social history Their use of alcohol, tobacco or illicit drugs Their current medications and supplements The patient's functional ability including ADLs,fall risks, home safety risks, cognitive, and hearing and visual impairment Diet and physical activities Evidence for depression or mood disorders  The patient's weight, height, BMI have been recorded in the chart.  I have made referrals, counseling, and provided education to the patient based on review of the above and I have provided the patient with a written personalized care plan for preventive services.     Vikki Ports, MD

## 2023-03-11 NOTE — Patient Instructions (Incomplete)
HEALTH MAINTENANCE RECOMMENDATIONS:  It is recommended that you get at least 30 minutes of aerobic exercise at least 5 days/week (for weight loss, you may need as much as 60-90 minutes). This can be any activity that gets your heart rate up. This can be divided in 10-15 minute intervals if needed, but try and build up your endurance at least once a week.  Weight bearing exercise is also recommended twice weekly.  Eat a healthy diet with lots of vegetables, fruits and fiber.  "Colorful" foods have a lot of vitamins (ie green vegetables, tomatoes, red peppers, etc).  Limit sweet tea, regular sodas and alcoholic beverages, all of which has a lot of calories and sugar.  Up to 1 alcoholic drink daily may be beneficial for women (unless trying to lose weight, watch sugars).  Drink a lot of water.  Calcium recommendations are 1200-1500 mg daily (1500 mg for postmenopausal women or women without ovaries), and vitamin D 1000 IU daily.  This should be obtained from diet and/or supplements (vitamins), and calcium should not be taken all at once, but in divided doses.  Monthly self breast exams and yearly mammograms for women over the age of 40 is recommended.  Sunscreen of at least SPF 30 should be used on all sun-exposed parts of the skin when outside between the hours of 10 am and 4 pm (not just when at beach or pool, but even with exercise, golf, tennis, and yard work!)  Use a sunscreen that says "broad spectrum" so it covers both UVA and UVB rays, and make sure to reapply every 1-2 hours.  Remember to change the batteries in your smoke detectors when changing your clock times in the spring and fall. Carbon monoxide detectors are recommended for your home.  Use your seat belt every time you are in a car, and please drive safely and not be distracted with cell phones and texting while driving.   April Garcia , Thank you for taking time to come for your Medicare Wellness Visit. I appreciate your ongoing  commitment to your health goals. Please review the following plan we discussed and let me know if I can assist you in the future.   This is a list of the screening recommended for you and due dates:  Health Maintenance  Topic Date Due   Flu Shot  07/27/2022   COVID-19 Vaccine (6 - 2023-24 season) 08/27/2022   DTaP/Tdap/Td vaccine (3 - Td or Tdap) 09/20/2022   Mammogram  11/30/2023   Medicare Annual Wellness Visit  03/13/2024   Colon Cancer Screening  06/16/2025   Pneumonia Vaccine  Completed   DEXA scan (bone density measurement)  Completed   Hepatitis C Screening: USPSTF Recommendation to screen - Ages 27-79 yo.  Completed   Zoster (Shingles) Vaccine  Completed   HPV Vaccine  Aged Out    Please get tetanus booster (TdaP) from the pharmacy. COVID booster was given today. We will update your vaccines when we get them from the pharmacy, showing that you had your flu and RSV vaccine this year.  Please eat more vegetables, a high fiber diet.  Make sure that you are getting enough protein--eat more protein, and limit portions of the carbs. Order of eating can sometimes make a difference too--eat fiber first, then protein, then carbs.  Please be sure to get regular exercise on the days you aren't in the pool.  Please bring [place] copies of your Living Will and [place] once completed and  notarized so that it can be scanned into your medical chart.  You do not need to be taking iron daily.  I recommend switching to a non-iron containing multivitamin.  You may use iron 2 weeks before donating blood (and up to 2-4 weeks afterwards, if needed), but not every day.

## 2023-03-14 ENCOUNTER — Encounter: Payer: Self-pay | Admitting: *Deleted

## 2023-03-14 ENCOUNTER — Encounter: Payer: Self-pay | Admitting: Family Medicine

## 2023-03-14 ENCOUNTER — Ambulatory Visit: Payer: Medicare PPO | Admitting: Family Medicine

## 2023-03-14 VITALS — BP 118/68 | HR 64 | Ht 67.0 in | Wt 282.6 lb

## 2023-03-14 DIAGNOSIS — E559 Vitamin D deficiency, unspecified: Secondary | ICD-10-CM

## 2023-03-14 DIAGNOSIS — E039 Hypothyroidism, unspecified: Secondary | ICD-10-CM | POA: Diagnosis not present

## 2023-03-14 DIAGNOSIS — K449 Diaphragmatic hernia without obstruction or gangrene: Secondary | ICD-10-CM | POA: Diagnosis not present

## 2023-03-14 DIAGNOSIS — Z5181 Encounter for therapeutic drug level monitoring: Secondary | ICD-10-CM

## 2023-03-14 DIAGNOSIS — Z23 Encounter for immunization: Secondary | ICD-10-CM | POA: Diagnosis not present

## 2023-03-14 DIAGNOSIS — Z0000 Encounter for general adult medical examination without abnormal findings: Secondary | ICD-10-CM

## 2023-03-14 DIAGNOSIS — I10 Essential (primary) hypertension: Secondary | ICD-10-CM | POA: Diagnosis not present

## 2023-03-14 DIAGNOSIS — G4733 Obstructive sleep apnea (adult) (pediatric): Secondary | ICD-10-CM

## 2023-03-14 DIAGNOSIS — Z6841 Body Mass Index (BMI) 40.0 and over, adult: Secondary | ICD-10-CM

## 2023-03-14 LAB — LIPID PANEL

## 2023-03-14 MED ORDER — AMLODIPINE BESYLATE 5 MG PO TABS: 5.0000 mg | ORAL_TABLET | Freq: Every day | ORAL | 3 refills | Status: DC

## 2023-03-15 LAB — CBC WITH DIFFERENTIAL/PLATELET
Basophils Absolute: 0 10*3/uL (ref 0.0–0.2)
Basos: 0 %
EOS (ABSOLUTE): 0.2 10*3/uL (ref 0.0–0.4)
Eos: 3 %
Hematocrit: 40.7 % (ref 34.0–46.6)
Hemoglobin: 12.2 g/dL (ref 11.1–15.9)
Immature Grans (Abs): 0 10*3/uL (ref 0.0–0.1)
Immature Granulocytes: 0 %
Lymphocytes Absolute: 1.2 10*3/uL (ref 0.7–3.1)
Lymphs: 16 %
MCH: 23.9 pg — ABNORMAL LOW (ref 26.6–33.0)
MCHC: 30 g/dL — ABNORMAL LOW (ref 31.5–35.7)
MCV: 80 fL (ref 79–97)
Monocytes Absolute: 0.5 10*3/uL (ref 0.1–0.9)
Monocytes: 7 %
Neutrophils Absolute: 5.8 10*3/uL (ref 1.4–7.0)
Neutrophils: 74 %
Platelets: 271 10*3/uL (ref 150–450)
RBC: 5.1 x10E6/uL (ref 3.77–5.28)
RDW: 15.1 % (ref 11.7–15.4)
WBC: 7.8 10*3/uL (ref 3.4–10.8)

## 2023-03-15 LAB — COMPREHENSIVE METABOLIC PANEL
ALT: 11 IU/L (ref 0–32)
AST: 13 IU/L (ref 0–40)
Albumin/Globulin Ratio: 1.6 (ref 1.2–2.2)
Albumin: 4.4 g/dL (ref 3.9–4.9)
Alkaline Phosphatase: 115 IU/L (ref 44–121)
BUN/Creatinine Ratio: 18 (ref 12–28)
BUN: 14 mg/dL (ref 8–27)
Bilirubin Total: 0.3 mg/dL (ref 0.0–1.2)
CO2: 20 mmol/L (ref 20–29)
Calcium: 9 mg/dL (ref 8.7–10.3)
Chloride: 105 mmol/L (ref 96–106)
Creatinine, Ser: 0.79 mg/dL (ref 0.57–1.00)
Globulin, Total: 2.8 g/dL (ref 1.5–4.5)
Glucose: 94 mg/dL (ref 70–99)
Potassium: 3.8 mmol/L (ref 3.5–5.2)
Sodium: 141 mmol/L (ref 134–144)
Total Protein: 7.2 g/dL (ref 6.0–8.5)
eGFR: 81 mL/min/{1.73_m2} (ref >59)

## 2023-03-15 LAB — LIPID PANEL
Chol/HDL Ratio: 2.6 ratio (ref 0.0–4.4)
Cholesterol, Total: 159 mg/dL (ref 100–199)
HDL: 62 mg/dL (ref >39)
LDL Chol Calc (NIH): 86 mg/dL (ref 0–99)
Triglycerides: 56 mg/dL (ref 0–149)
VLDL Cholesterol Cal: 11 mg/dL (ref 5–40)

## 2023-03-22 ENCOUNTER — Other Ambulatory Visit: Payer: Self-pay | Admitting: Endocrinology

## 2023-03-22 DIAGNOSIS — E049 Nontoxic goiter, unspecified: Secondary | ICD-10-CM

## 2023-04-04 DIAGNOSIS — H26493 Other secondary cataract, bilateral: Secondary | ICD-10-CM | POA: Diagnosis not present

## 2023-04-20 ENCOUNTER — Ambulatory Visit
Admission: RE | Admit: 2023-04-20 | Discharge: 2023-04-20 | Disposition: A | Discharge status: Home / Self Care | Payer: Medicare PPO | Source: Ambulatory Visit | Attending: Endocrinology | Admitting: Endocrinology

## 2023-04-20 DIAGNOSIS — E041 Nontoxic single thyroid nodule: Secondary | ICD-10-CM | POA: Diagnosis not present

## 2023-04-20 DIAGNOSIS — E049 Nontoxic goiter, unspecified: Secondary | ICD-10-CM

## 2023-04-22 DIAGNOSIS — E039 Hypothyroidism, unspecified: Secondary | ICD-10-CM | POA: Diagnosis not present

## 2023-04-29 DIAGNOSIS — E039 Hypothyroidism, unspecified: Secondary | ICD-10-CM | POA: Diagnosis not present

## 2023-04-29 DIAGNOSIS — E049 Nontoxic goiter, unspecified: Secondary | ICD-10-CM | POA: Diagnosis not present

## 2023-04-29 DIAGNOSIS — I10 Essential (primary) hypertension: Secondary | ICD-10-CM | POA: Diagnosis not present

## 2023-06-08 DIAGNOSIS — H02413 Mechanical ptosis of bilateral eyelids: Secondary | ICD-10-CM | POA: Diagnosis not present

## 2023-06-08 DIAGNOSIS — H26492 Other secondary cataract, left eye: Secondary | ICD-10-CM | POA: Diagnosis not present

## 2023-06-08 DIAGNOSIS — H02831 Dermatochalasis of right upper eyelid: Secondary | ICD-10-CM | POA: Diagnosis not present

## 2023-06-08 DIAGNOSIS — H18413 Arcus senilis, bilateral: Secondary | ICD-10-CM | POA: Diagnosis not present

## 2023-07-06 DIAGNOSIS — H26491 Other secondary cataract, right eye: Secondary | ICD-10-CM | POA: Diagnosis not present

## 2023-09-27 DIAGNOSIS — G4733 Obstructive sleep apnea (adult) (pediatric): Secondary | ICD-10-CM | POA: Diagnosis not present

## 2024-01-05 DIAGNOSIS — H57813 Brow ptosis, bilateral: Secondary | ICD-10-CM | POA: Diagnosis not present

## 2024-01-05 DIAGNOSIS — H02413 Mechanical ptosis of bilateral eyelids: Secondary | ICD-10-CM | POA: Diagnosis not present

## 2024-01-05 DIAGNOSIS — H53483 Generalized contraction of visual field, bilateral: Secondary | ICD-10-CM | POA: Diagnosis not present

## 2024-01-05 DIAGNOSIS — H02832 Dermatochalasis of right lower eyelid: Secondary | ICD-10-CM | POA: Diagnosis not present

## 2024-01-05 DIAGNOSIS — H02411 Mechanical ptosis of right eyelid: Secondary | ICD-10-CM | POA: Diagnosis not present

## 2024-01-05 DIAGNOSIS — H0279 Other degenerative disorders of eyelid and periocular area: Secondary | ICD-10-CM | POA: Diagnosis not present

## 2024-01-05 DIAGNOSIS — H02834 Dermatochalasis of left upper eyelid: Secondary | ICD-10-CM | POA: Diagnosis not present

## 2024-01-05 DIAGNOSIS — H02835 Dermatochalasis of left lower eyelid: Secondary | ICD-10-CM | POA: Diagnosis not present

## 2024-01-05 DIAGNOSIS — H02831 Dermatochalasis of right upper eyelid: Secondary | ICD-10-CM | POA: Diagnosis not present

## 2024-01-25 DIAGNOSIS — H53483 Generalized contraction of visual field, bilateral: Secondary | ICD-10-CM | POA: Diagnosis not present

## 2024-02-28 ENCOUNTER — Other Ambulatory Visit: Payer: Self-pay | Admitting: Family Medicine

## 2024-02-28 DIAGNOSIS — Z1231 Encounter for screening mammogram for malignant neoplasm of breast: Secondary | ICD-10-CM

## 2024-02-29 ENCOUNTER — Ambulatory Visit
Admission: RE | Admit: 2024-02-29 | Discharge: 2024-02-29 | Disposition: A | Discharge status: Home / Self Care | Source: Ambulatory Visit | Attending: Family Medicine | Admitting: Family Medicine

## 2024-02-29 DIAGNOSIS — Z1231 Encounter for screening mammogram for malignant neoplasm of breast: Secondary | ICD-10-CM

## 2024-03-26 ENCOUNTER — Other Ambulatory Visit: Payer: Self-pay | Admitting: Family Medicine

## 2024-03-26 DIAGNOSIS — I10 Essential (primary) hypertension: Secondary | ICD-10-CM

## 2024-04-03 DIAGNOSIS — H5212 Myopia, left eye: Secondary | ICD-10-CM | POA: Diagnosis not present

## 2024-04-30 DIAGNOSIS — E039 Hypothyroidism, unspecified: Secondary | ICD-10-CM | POA: Diagnosis not present

## 2024-05-07 DIAGNOSIS — E039 Hypothyroidism, unspecified: Secondary | ICD-10-CM | POA: Diagnosis not present

## 2024-05-07 DIAGNOSIS — I10 Essential (primary) hypertension: Secondary | ICD-10-CM | POA: Diagnosis not present

## 2024-05-07 DIAGNOSIS — E049 Nontoxic goiter, unspecified: Secondary | ICD-10-CM | POA: Diagnosis not present

## 2024-05-09 DIAGNOSIS — H02831 Dermatochalasis of right upper eyelid: Secondary | ICD-10-CM | POA: Diagnosis not present

## 2024-05-09 DIAGNOSIS — H57813 Brow ptosis, bilateral: Secondary | ICD-10-CM | POA: Diagnosis not present

## 2024-05-09 DIAGNOSIS — H02413 Mechanical ptosis of bilateral eyelids: Secondary | ICD-10-CM | POA: Diagnosis not present

## 2024-05-09 DIAGNOSIS — H02411 Mechanical ptosis of right eyelid: Secondary | ICD-10-CM | POA: Diagnosis not present

## 2024-05-09 DIAGNOSIS — H02834 Dermatochalasis of left upper eyelid: Secondary | ICD-10-CM | POA: Diagnosis not present

## 2024-05-09 DIAGNOSIS — H02835 Dermatochalasis of left lower eyelid: Secondary | ICD-10-CM | POA: Diagnosis not present

## 2024-05-09 DIAGNOSIS — H53483 Generalized contraction of visual field, bilateral: Secondary | ICD-10-CM | POA: Diagnosis not present

## 2024-05-09 DIAGNOSIS — H02832 Dermatochalasis of right lower eyelid: Secondary | ICD-10-CM | POA: Diagnosis not present

## 2024-05-09 DIAGNOSIS — H02412 Mechanical ptosis of left eyelid: Secondary | ICD-10-CM | POA: Diagnosis not present

## 2024-05-09 HISTORY — PX: BROW LIFT AND BLEPHAROPLASTY: SHX1271

## 2024-05-16 ENCOUNTER — Telehealth: Payer: Self-pay | Admitting: Family Medicine

## 2024-05-16 ENCOUNTER — Encounter: Payer: Medicare PPO | Admitting: Family Medicine

## 2024-05-16 NOTE — Telephone Encounter (Unsigned)
 Copied from CRM [id number]. Topic: Clinical - Request for Lab/Test Order >> May 16, 2024  3:41 PM April Garcia wrote: Reason for CRM: Patient called in regarding being seen for her labs before the appointment on 05/29 , would like for someone to give her a callback when the orders are in

## 2024-05-17 ENCOUNTER — Telehealth: Payer: Self-pay | Admitting: *Deleted

## 2024-05-17 DIAGNOSIS — Z5181 Encounter for therapeutic drug level monitoring: Secondary | ICD-10-CM

## 2024-05-17 DIAGNOSIS — I10 Essential (primary) hypertension: Secondary | ICD-10-CM

## 2024-05-17 NOTE — Telephone Encounter (Signed)
 Called patient and scheduled her for 05/22/24 for labs.

## 2024-05-17 NOTE — Telephone Encounter (Signed)
 Patient has CPE scheduled for 05/24/24 and scheduled for labs 05/22/24 and needs orders please.

## 2024-05-18 NOTE — Addendum Note (Signed)
 Addended by: Robson Trickey on: 05/18/2024 01:58 PM   Modules accepted: Orders

## 2024-05-22 ENCOUNTER — Other Ambulatory Visit

## 2024-05-22 DIAGNOSIS — Z5181 Encounter for therapeutic drug level monitoring: Secondary | ICD-10-CM

## 2024-05-22 DIAGNOSIS — I10 Essential (primary) hypertension: Secondary | ICD-10-CM | POA: Diagnosis not present

## 2024-05-22 LAB — COMPREHENSIVE METABOLIC PANEL WITH GFR
ALT: 11 IU/L (ref 0–32)
AST: 10 IU/L (ref 0–40)
Albumin: 4 g/dL (ref 3.9–4.9)
Alkaline Phosphatase: 102 IU/L (ref 44–121)
BUN/Creatinine Ratio: 21 (ref 12–28)
BUN: 16 mg/dL (ref 8–27)
Bilirubin Total: 0.3 mg/dL (ref 0.0–1.2)
CO2: 21 mmol/L (ref 20–29)
Calcium: 9.3 mg/dL (ref 8.7–10.3)
Chloride: 106 mmol/L (ref 96–106)
Creatinine, Ser: 0.77 mg/dL (ref 0.57–1.00)
Globulin, Total: 2.5 g/dL (ref 1.5–4.5)
Glucose: 97 mg/dL (ref 70–99)
Potassium: 4.3 mmol/L (ref 3.5–5.2)
Sodium: 141 mmol/L (ref 134–144)
Total Protein: 6.5 g/dL (ref 6.0–8.5)
eGFR: 83 mL/min/{1.73_m2} (ref >59)

## 2024-05-22 LAB — CBC WITH DIFFERENTIAL/PLATELET
Basophils Absolute: 0 10*3/uL (ref 0.0–0.2)
Basos: 0 %
EOS (ABSOLUTE): 0.3 10*3/uL (ref 0.0–0.4)
Eos: 4 %
Hematocrit: 41.8 % (ref 34.0–46.6)
Hemoglobin: 13 g/dL (ref 11.1–15.9)
Immature Grans (Abs): 0 10*3/uL (ref 0.0–0.1)
Immature Granulocytes: 0 %
Lymphocytes Absolute: 1.4 10*3/uL (ref 0.7–3.1)
Lymphs: 18 %
MCH: 26.5 pg — ABNORMAL LOW (ref 26.6–33.0)
MCHC: 31.1 g/dL — ABNORMAL LOW (ref 31.5–35.7)
MCV: 85 fL (ref 79–97)
Monocytes Absolute: 0.6 10*3/uL (ref 0.1–0.9)
Monocytes: 7 %
Neutrophils Absolute: 5.5 10*3/uL (ref 1.4–7.0)
Neutrophils: 71 %
Platelets: 219 10*3/uL (ref 150–450)
RBC: 4.9 x10E6/uL (ref 3.77–5.28)
RDW: 16.3 % — ABNORMAL HIGH (ref 11.7–15.4)
WBC: 7.8 10*3/uL (ref 3.4–10.8)

## 2024-05-23 NOTE — Progress Notes (Unsigned)
 No chief complaint on file.  April Garcia is a 68 y.o. female who presents for annual physical exam, Medicare AWV, and follow-up on chronic medical conditions.   See below for labs done prior to visit.  OSA: She reports compliance with CPAP.  She continues to note less daytime somnolence, feels refreshed in the mornings.   Hypertension follow-up. She reports compliance with amlodipine , and denies any side effects. BP has been running  ***  Monitor was verified as accurate 12/2019 (wrist monitor). She denies any dizziness, headaches, chest pain, edema.  BP Readings from Last 3 Encounters:  03/14/23 118/68  10/29/21 120/70  04/23/21 134/84     H/o GI bleed 01/2018 (EGD showed 6cm hiatal hernia, ulcers, gastric erosions, Schatzki's ring which was dilated, benign gastric nodules and chronic peptic duodenitis). On colonoscopy she had 10mm adenomatous polyp (and 2 hyperplastic ones). She had repeat colonoscopy in June 2021, and had a sessile serrated adenoma.  Repeat colonoscopy recommended 5 years. Denies further bleeding or recurrent anemia.   She was previously told to stay on PPI due to hiatal hernia.  She is prescribed Dexilant (Pantoprazole caused diarrhea), but only uses it prn. She reports that her GI is aware that she doesn't take it daily.  She uses it a few times/month, depending on what she eats.  Denies chest or abdominal pain, dysphagia.  Denies hematochezia, melena, diarrhea, constipation.   Hypothyroidism:  Treated and monitored by Dr. Ronelle Coffee.  No changes in hair/skin/moods/energy.  She was last seen earlier this month, and TSH 04/30/24 was 1.620. She continues on 137 mcg dose and denies any concerns.  She had f/u thyroid  US  in 03/2023.  Thyroid  US  was ordered at her recent visit. IMPRESSION: Solitary TI-RADS 3 right superior thyroid  nodule does not demonstrate significant growth since 10/24/2018. A follow-up ultrasound should be performed in 1 year to document 5 years  of stability.  Ho Vitamin D  deficiency. Last level was 37 in 10/2021, when not taking D or MVI very regularly. She is currently taking MVI daily.   Obesity:  Last year her weight was up. She reported that her home weight was up to 291# in January 2024, was down to 279#  02/2023. She reports she wasn't being as careful with her diet, "eating what I wanted". She had lost weight by being more aware of what she was eating--cutting out late night snacking, being aware of when she is full. Has carbs for breakfast and lunch (whole wheat bread, potatoes or rice), but not at dinner. Going to water aerobics 2-3x/week. She isn't using the exercise bike regularly.  ***UPDATE   Immunization History  Administered Date(s) Administered   Fluad Quad(high Dose 65+) 10/24/2020, 10/29/2021   Influenza Split 09/26/2012   Influenza, High Dose Seasonal PF 10/29/2022   Influenza,inj,Quad PF,6+ Mos 12/24/2014, 12/24/2015, 10/10/2017, 10/02/2018   Influenza-Unspecified 10/25/2016, 09/16/2019   PFIZER Comirnaty Martina Sledge Top)Covid-19 Tri-Sucrose Vaccine 04/23/2021   PFIZER(Purple Top)SARS-COV-2 Vaccination 02/20/2020, 03/19/2020, 10/24/2020   Pfizer Covid-19 Vaccine Bivalent Booster 89yrs & up 10/29/2021   Pfizer(Comirnaty )Fall Seasonal Vaccine 12 years and older 10/29/2022, 03/14/2023   Pneumococcal Conjugate-13 10/15/2019   Pneumococcal Polysaccharide-23 11/07/2020   Respiratory Syncytial Virus Vaccine,Recomb Aduvanted(Arexvy) 10/29/2022   Td 02/24/2005   Tdap 09/20/2012   Zoster Recombinant(Shingrix ) 10/02/2018, 12/11/2018   Zoster, Live 12/24/2014   Last Pap smear: 2007 (she is s/p hysterectomy) Last mammogram: 02/2024 Last colonoscopy: 05/2020 (5 year f/u rec) Last DEXA: 11/2019 T-1.3 L fem neck Dentist: every 3-6 months for cleanings Ophtho:  regularly Exercise:    Water aerobics 2-3x/week, exercise bike sporadically. Walks dog (very slow, no cardio).  Lipids: Lab Results  Component Value Date    CHOL 159 03/14/2023   HDL 62 03/14/2023   LDLCALC 86 03/14/2023   TRIG 56 03/14/2023   CHOLHDL 2.6 03/14/2023    Patient Care Team: Roosvelt Colla, MD as PCP - General (Family Medicine) Tasia Farr, MD as Referring Physician (Endocrinology) Felecia Hopper, MD as Consulting Physician (Gastroenterology) [place], Patricia Boon [facility]) Avis Boehringer, MD as Consulting Physician (Dermatology) Aguero,Beatriz DDS (Dentistry) Dr. Rica Chalet, MD as Consulting Physician (Vascular Surgery)   Depression Screening: Flowsheet Row Office Visit from 03/14/2023 in [facility]  PHQ-2 Total Score 0        Falls screen:     03/14/2023    8:38 AM 10/29/2021    1:43 PM 04/23/2021   10:47 AM 10/22/2020    1:52 PM 10/15/2019    1:40 PM  Fall Risk   Falls in the past year? 0 0 0 0 0  Number falls in past yr: 0 0 0  0  Injury with Fall? 0 0 0  0  Risk for fall due to : No Fall Risks No Fall Risks No Fall Risks    Follow up Falls evaluation completed Falls evaluation completed Falls evaluation completed       Functional Status Survey:         End of Life Discussion:  Patient does not have a living will and medical power of attorney. Forms given in the past, still has them at home.   PMH, PSH, SH and FH were reviewed and updated     ROS:  The patient denies anorexia, fever, headaches,  vision changes (cataracts noted by ophtho, slight decline noted), decreased hearing, ear pain, sore throat, breast concerns, chest pain, palpitations, dizziness, syncope, dyspnea on exertion, cough, swelling, nausea, vomiting, diarrhea, constipation, abdominal pain, melena, hematochezia, indigestion/heartburn, hematuria, incontinence, dysuria, vaginal bleeding, discharge, odor or itch, genital lesions, joint pains, numbness, tingling, weakness, tremor, suspicious skin lesions, depression, anxiety, abnormal bleeding/bruising, or enlarged lymph nodes.  . Some overall stiffness with sitting too  long; denies joint pains. Weight   PHYSICAL EXAM:  There were no vitals taken for this visit.  Wt Readings from Last 3 Encounters:  03/14/23 282 lb 9.6 oz (128.2 kg)  10/29/21 255 lb (115.7 kg)  04/23/21 261 lb 12.8 oz (118.8 kg)   General Appearance:      Alert, cooperative, no distress, appears stated age.  Head:      Normocephalic, without obvious abnormality, atraumatic    Eyes:      PERRL, conjunctiva/corneas clear, EOM's intact, fundi not well visualized  Ears:      Normal TM's and external ear canals    Nose:     No drainage or sinus tenderness  Throat:     Normal mucosa  Neck:     Supple, no mass, lymphadenopathy. Thyroid  is mildly enlarged, more prominent on the right, nontender; no carotid bruit or JVD.   Back:      Spine nontender, no curvature, ROM normal, no CVA     tenderness    Lungs:       Clear to auscultation bilaterally without wheezes, rales or ronchi; respirations unlabored    Chest Wall:      No tenderness or deformity     Heart:      Regular rate and rhythm, S1 and S2 normal, no murmur, rub  or gallop    Breast Exam:      No tenderness, masses, or nipple discharge or inversion. Small skin tag at left nipple. No axillary lymphadenopathy    Abdomen:       Soft, non-tender, obese, nondistended, normoactive bowel sounds, no masses, no hepatosplenomegaly    Genitalia:      Normal external genitalia without lesions.  BUS and vagina normal; surgically absent uterus.  No adnexal masses or tenderness, though exam is limited by body habitus.  Pap not performed. ***  Rectal:    Normal sphincter tone, no masses.  Heme negative stool ***  Extremities:     No clubbing, cyanosis or edema.  LLE There is a patch 10 x 8 cm that is hyperpigmented, thickened, slightly flaky skin. Smaller scattered similar patches on the R shin ***  Pulses:     2+ and symmetric all extremities    Skin:     Skin color, texture, turgor normal.  Scattered skin tags and SK's present.  Onychomycosis of  toenails. Absent great toenails and pinkie nails. Tubers present at L great toe, 5th toe, L 3rd finger below the nail and R ring finger (below the nail), L ring finger  on ulnar side of nail. ***  Lymph nodes:     Cervical, supraclavicular, inguinal and axillary nodes normal    Neurologic:     Normal strength, sensation and gait; reflexes 2+ and symmetric throughout                       Psych:   Normal mood, affect, hygiene and grooming   ***UPDATE skin--extremities, tubers, pelvic/rectal, hands  Lab Results  Component Value Date   WBC 7.8 05/22/2024   HGB 13.0 05/22/2024   HCT 41.8 05/22/2024   MCV 85 05/22/2024   PLT 219 05/22/2024   Fasting glucose 97    Chemistry      Component Value Date/Time   NA 141 05/22/2024 0850   K 4.3 05/22/2024 0850   CL 106 05/22/2024 0850   CO2 21 05/22/2024 0850   BUN 16 05/22/2024 0850   CREATININE 0.77 05/22/2024 0850   CREATININE 0.71 12/29/2016 0949      Component Value Date/Time   CALCIUM 9.3 05/22/2024 0850   ALKPHOS 102 05/22/2024 0850   AST 10 05/22/2024 0850   ALT 11 05/22/2024 0850   BILITOT 0.3 05/22/2024 0850       ASSESSMENT/PLAN:  Flu, covid and Tdap from pharmacy?? Prevnar-20  Can skip pelvic/rectal if no complaints   Discussed monthly self breast exams and yearly mammograms; at least 30 minutes of aerobic activity at least 5 days/week and weight-bearing exercise 2x/week; proper sunscreen use reviewed; healthy diet, including goals of calcium and vitamin D  intake and alcohol recommendations (less than or equal to 1 drink/day) reviewed; regular seatbelt use; changing batteries in smoke detectors, use of carbon monoxide detectors.  Immunization recommendations discussed. Continue yearly high dose flu shots.   Prevnar-20 ***  TdaP is past due, reminded to get from pharmacy. COVID boosters discussed. Colonoscopy recommendations reviewed, due again 05/2025  MOST form reviewed/updated, full code, full care. Living Will  and Healthcare power of attorney discussed, paperwork was given in the past, not returned. Reminded to get us  a copy to be scanned into chart once she has completed them.    Medicare Attestation I have personally reviewed: The patient's medical and social history Their use of alcohol, tobacco or illicit drugs Their current medications and  supplements The patient's functional ability including ADLs,fall risks, home safety risks, cognitive, and hearing and visual impairment Diet and physical activities Evidence for depression or mood disorders  The patient's weight, height, BMI have been recorded in the chart.  I have made referrals, counseling, and provided education to the patient based on review of the above and I have provided the patient with a written personalized care plan for preventive services.     Paulett Boros, MD

## 2024-05-23 NOTE — Patient Instructions (Signed)
  HEALTH MAINTENANCE RECOMMENDATIONS:  It is recommended that you get at least 30 minutes of aerobic exercise at least 5 days/week (for weight loss, you may need as much as 60-90 minutes). This can be any activity that gets your heart rate up. This can be divided in 10-15 minute intervals if needed, but try and build up your endurance at least once a week.  Weight bearing exercise is also recommended twice weekly.  Eat a healthy diet with lots of vegetables, fruits and fiber.  "Colorful" foods have a lot of vitamins (ie green vegetables, tomatoes, red peppers, etc).  Limit sweet tea, regular sodas and alcoholic beverages, all of which has a lot of calories and sugar.  Up to 1 alcoholic drink daily may be beneficial for women (unless trying to lose weight, watch sugars).  Drink a lot of water.  Calcium recommendations are 1200-1500 mg daily (1500 mg for postmenopausal women or women without ovaries), and vitamin D  1000 IU daily.  This should be obtained from diet and/or supplements (vitamins), and calcium should not be taken all at once, but in divided doses.  Monthly self breast exams and yearly mammograms for women over the age of 69 is recommended.  Sunscreen of at least SPF 30 should be used on all sun-exposed parts of the skin when outside between the hours of 10 am and 4 pm (not just when at beach or pool, but even with exercise, golf, tennis, and yard work!)  Use a sunscreen that says "broad spectrum" so it covers both UVA and UVB rays, and make sure to reapply every 1-2 hours.  Remember to change the batteries in your smoke detectors when changing your clock times in the spring and fall. Carbon monoxide detectors are recommended for your home.  Use your seat belt every time you are in a car, and please drive safely and not be distracted with cell phones and texting while driving.   Ms. Stockham , Thank you for taking time to come for your Medicare Wellness Visit. I appreciate your ongoing  commitment to your health goals. Please review the following plan we discussed and let me know if I can assist you in the future.   This is a list of the screening recommended for you and due dates:  Health Maintenance  Topic Date Due   DTaP/Tdap/Td vaccine (3 - Td or Tdap) 09/20/2022   COVID-19 Vaccine (8 - 2024-25 season) 08/28/2023   Medicare Annual Wellness Visit  03/13/2024   Flu Shot  07/27/2024   Mammogram  02/28/2025   Colon Cancer Screening  06/16/2025   Pneumonia Vaccine  Completed   DEXA scan (bone density measurement)  Completed   Hepatitis C Screening  Completed   Zoster (Shingles) Vaccine  Completed   HPV Vaccine  Aged Out   Meningitis B Vaccine  Aged Out   Please bring us  copies of your Living Will and Healthcare Power of Attorney once completed and notarized so that it can be scanned into your medical chart.  Please get tetanus booster (TdaP) from the pharmacy. Continue to get yearly high dose flu shots, and COVID boosters (can get every 6 months for folks >65)  Prevnar-20 (updated pneumonia vaccine) ***

## 2024-05-24 ENCOUNTER — Encounter: Payer: Self-pay | Admitting: Family Medicine

## 2024-05-24 ENCOUNTER — Ambulatory Visit: Payer: Medicare PPO | Admitting: Family Medicine

## 2024-05-24 VITALS — BP 128/72 | HR 74 | Ht 67.0 in | Wt 269.6 lb

## 2024-05-24 DIAGNOSIS — M8580 Other specified disorders of bone density and structure, unspecified site: Secondary | ICD-10-CM

## 2024-05-24 DIAGNOSIS — K449 Diaphragmatic hernia without obstruction or gangrene: Secondary | ICD-10-CM | POA: Diagnosis not present

## 2024-05-24 DIAGNOSIS — Z23 Encounter for immunization: Secondary | ICD-10-CM

## 2024-05-24 DIAGNOSIS — G4733 Obstructive sleep apnea (adult) (pediatric): Secondary | ICD-10-CM

## 2024-05-24 DIAGNOSIS — E66813 Obesity, class 3: Secondary | ICD-10-CM

## 2024-05-24 DIAGNOSIS — E039 Hypothyroidism, unspecified: Secondary | ICD-10-CM

## 2024-05-24 DIAGNOSIS — Z0000 Encounter for general adult medical examination without abnormal findings: Secondary | ICD-10-CM | POA: Diagnosis not present

## 2024-05-24 DIAGNOSIS — I10 Essential (primary) hypertension: Secondary | ICD-10-CM

## 2024-05-24 DIAGNOSIS — E559 Vitamin D deficiency, unspecified: Secondary | ICD-10-CM

## 2024-05-24 DIAGNOSIS — Z780 Asymptomatic menopausal state: Secondary | ICD-10-CM | POA: Diagnosis not present

## 2024-05-24 DIAGNOSIS — Z6841 Body Mass Index (BMI) 40.0 and over, adult: Secondary | ICD-10-CM

## 2024-06-21 ENCOUNTER — Other Ambulatory Visit: Payer: Self-pay | Admitting: Family Medicine

## 2024-06-21 DIAGNOSIS — I10 Essential (primary) hypertension: Secondary | ICD-10-CM

## 2024-06-25 DIAGNOSIS — M7989 Other specified soft tissue disorders: Secondary | ICD-10-CM | POA: Diagnosis not present

## 2024-06-25 DIAGNOSIS — Q851 Tuberous sclerosis: Secondary | ICD-10-CM | POA: Diagnosis not present

## 2024-08-09 DIAGNOSIS — Z888 Allergy status to other drugs, medicaments and biological substances status: Secondary | ICD-10-CM | POA: Diagnosis not present

## 2024-08-09 DIAGNOSIS — M7989 Other specified soft tissue disorders: Secondary | ICD-10-CM | POA: Diagnosis not present

## 2024-08-09 DIAGNOSIS — G4730 Sleep apnea, unspecified: Secondary | ICD-10-CM | POA: Diagnosis not present

## 2024-08-09 DIAGNOSIS — R2232 Localized swelling, mass and lump, left upper limb: Secondary | ICD-10-CM | POA: Diagnosis not present

## 2024-08-09 DIAGNOSIS — I10 Essential (primary) hypertension: Secondary | ICD-10-CM | POA: Diagnosis not present

## 2024-08-09 DIAGNOSIS — E039 Hypothyroidism, unspecified: Secondary | ICD-10-CM | POA: Diagnosis not present

## 2024-08-09 DIAGNOSIS — Z79899 Other long term (current) drug therapy: Secondary | ICD-10-CM | POA: Diagnosis not present

## 2024-08-09 DIAGNOSIS — D2362 Other benign neoplasm of skin of left upper limb, including shoulder: Secondary | ICD-10-CM | POA: Diagnosis not present

## 2024-08-23 DIAGNOSIS — Z4889 Encounter for other specified surgical aftercare: Secondary | ICD-10-CM | POA: Diagnosis not present

## 2024-11-10 ENCOUNTER — Other Ambulatory Visit (HOSPITAL_BASED_OUTPATIENT_CLINIC_OR_DEPARTMENT_OTHER)

## 2024-12-28 ENCOUNTER — Ambulatory Visit (HOSPITAL_BASED_OUTPATIENT_CLINIC_OR_DEPARTMENT_OTHER)
Admission: RE | Admit: 2024-12-28 | Discharge: 2024-12-28 | Disposition: A | Discharge status: Home / Self Care | Source: Ambulatory Visit | Attending: Family Medicine | Admitting: Family Medicine

## 2024-12-28 DIAGNOSIS — Z780 Asymptomatic menopausal state: Secondary | ICD-10-CM | POA: Diagnosis present

## 2024-12-28 DIAGNOSIS — M8580 Other specified disorders of bone density and structure, unspecified site: Secondary | ICD-10-CM | POA: Diagnosis present

## 2024-12-30 ENCOUNTER — Ambulatory Visit: Payer: Self-pay | Admitting: Family Medicine

## 2025-06-12 ENCOUNTER — Encounter: Payer: Self-pay | Admitting: Family Medicine
